# Patient Record
Sex: Female | Born: 1965 | Race: Black or African American | Hispanic: No | Marital: Single | Smoking: Never smoker
Health system: Southern US, Community
[De-identification: ages and names within clinical notes are randomized; demographics above are authoritative.]

## PROBLEM LIST (undated history)

## (undated) DIAGNOSIS — J810 Acute pulmonary edema: Secondary | ICD-10-CM

## (undated) DIAGNOSIS — I351 Nonrheumatic aortic (valve) insufficiency: Secondary | ICD-10-CM

## (undated) DIAGNOSIS — I350 Nonrheumatic aortic (valve) stenosis: Secondary | ICD-10-CM

## (undated) DIAGNOSIS — E7800 Pure hypercholesterolemia, unspecified: Secondary | ICD-10-CM

## (undated) DIAGNOSIS — I493 Ventricular premature depolarization: Secondary | ICD-10-CM

## (undated) DIAGNOSIS — I428 Other cardiomyopathies: Secondary | ICD-10-CM

## (undated) HISTORY — DX: Other cardiomyopathies: I42.8

## (undated) HISTORY — DX: Nonrheumatic aortic (valve) insufficiency: I35.1

## (undated) HISTORY — DX: Ventricular premature depolarization: I49.3

## (undated) HISTORY — DX: Nonrheumatic aortic (valve) stenosis: I35.0

## (undated) HISTORY — DX: Acute pulmonary edema: J81.0

## (undated) HISTORY — DX: Pure hypercholesterolemia, unspecified: E78.00

---

## 2000-05-14 ENCOUNTER — Encounter: Payer: Self-pay | Admitting: Emergency Medicine

## 2000-05-14 ENCOUNTER — Encounter: Payer: Self-pay | Admitting: Cardiology

## 2000-05-14 ENCOUNTER — Inpatient Hospital Stay (HOSPITAL_COMMUNITY): Admission: EM | Admit: 2000-05-14 | Discharge: 2000-05-16 | Payer: Self-pay | Admitting: Emergency Medicine

## 2002-03-01 ENCOUNTER — Encounter: Payer: Self-pay | Admitting: Emergency Medicine

## 2002-03-01 ENCOUNTER — Emergency Department (HOSPITAL_COMMUNITY): Admission: EM | Admit: 2002-03-01 | Discharge: 2002-03-01 | Payer: Self-pay | Admitting: Emergency Medicine

## 2004-05-06 HISTORY — PX: US ECHOCARDIOGRAPHY: HXRAD669

## 2008-08-28 HISTORY — PX: US ECHOCARDIOGRAPHY: HXRAD669

## 2009-10-06 ENCOUNTER — Ambulatory Visit: Payer: Self-pay | Admitting: Cardiology

## 2010-01-31 DIAGNOSIS — J810 Acute pulmonary edema: Secondary | ICD-10-CM

## 2010-01-31 HISTORY — DX: Acute pulmonary edema: J81.0

## 2010-06-18 NOTE — Discharge Summary (Signed)
[facility]  Patient:    Wendy Stanley, Wendy Stanley                   MRN: [id number] Adm. Date:  [id number] Disc. Date: 05/16/00 Attending:  Lillia Mountain CC:         Clovis Pu. Patty Sermons, M.D.   Discharge Summary  REASON FOR ADMISSION:         A 45 year old healthy black female who the morning of April 14 suddenly became severely short of breath associated with some mild dizziness, but no chest pain.  She was brought to the ER and found to be in acute pulmonary edema of new onset.  PHYSICAL EXAMINATION  VITAL SIGNS:                  Initial blood pressure 213/93, dropped down to 154/88, heart rate 145, respirations 32, temperature 97.3.  LUNGS:                        Crackles at the bases.  HEART:                        Tachycardic, regular, initially no murmur or gallop but when heart rate dropped she had a 2/6 diastolic murmur along the left sternal border with a ______ gallop.  EXTREMITIES:                  No edema.  LABORATORIES:                 Sodium 139, potassium 4.5, chloride 109, bicarbonate 24, glucose 117, BUN 10, creatinine 1.0, calcium 9.2.  CK 292, CPK-MB 2.8, troponin 0.05.                                Chest x-ray showed diffuse alveolar bilateral infiltrates with a normal sized cardiac silhouette.  EKG showed sinus tachycardia with nonspecific lateral ST-T wave changes.  HOSPITAL COURSE:              #1 - PULMONARY EDEMA:  The patient was admitted with acute pulmonary edema.  She was given one dose of Lasix 40 mg IV in the ER and diuresed very well with resolution of her symptoms by later on the day of admission.  She was started on Lovenox and aspirin.  Serial cardiac enzymes showed negative CPK-MBs and borderline troponins.  A repeat chest x-ray later the day of her admission showed resolution of her infiltrates.  She was seen by Dr. Patty Sermons of the cardiology service.  His initial impression was acute pulmonary edema  of undetermined etiology, rule out silent MI versus valvular heart disease.  A CT scan of the chest was done to rule out an aortic dissection and this was negative for dissection.  Blood cultures were drawn to rule out SVD and were negative at the time of this dictation.  The patient remained afebrile and her ESR was only 28.  An echocardiogram was done on April 15 and preliminary review by Dr. Patty Sermons indicated the aortic valve may be congenitally bicuspid.  There was moderate aortic insufficiency with a small LV chamber size which was not hypertrophied and showed normal systolic function.  The patient was started on hydrochlorothiazide, lisinopril, and potassium by Dr. Patty Sermons.  He recommended medical therapy and blood pressure control at this point with clinical followup.  She may  at some point require aortic valve replacement.  The patient was also started on SVE prophylaxis for dental and surgical procedures.  Dr. Patty Sermons recommended avoiding strenuous isometric exercise.                                The patients blood pressure was elevated in the hospital as high as 196/85.  She was started on lisinopril and hydrochlorothiazide and at discharge her blood pressure was 144/71.  Her tachycardia was resolving.                                The patients TSH was initially elevated at approximately 10.  A repeat TSH was normal at 2.8.  The patient also was started on a 24 hour urine for metanephrines.  DISCHARGE DIAGNOSES:          1. Acute pulmonary edema.                               2. Aortic insufficiency.                               3. Hypertension.  PROCEDURE:                    1. A 2-D echocardiogram.                               2. CT scan of the chest.  DISCHARGE MEDICATIONS:        1. Lisinopril 10 mg q.d.                               2. Hydrochlorothiazide 25 mg q.d.                               3. Potassium chloride 20 mEq q.d.                                4. Multivitamin.                               5. Amoxicillin 2000 mg one hour prior to dental                                 procedures.  ACTIVITY:                     No heavy exercise until seen by Dr. Patty Sermons in two weeks.  DIET:                         No added salt.  DISCHARGE INSTRUCTIONS:       Return to work May 22, 2000.  FOLLOW-UP:                    Dr. Patty Sermons two weeks.  Dr. Valentina Lucks in four weeks. DD:  05/16/00 TD:  05/16/00 Job: [id number]

## 2010-06-18 NOTE — Consult Note (Signed)
[facility]  Patient:    Wendy Stanley, Wendy Stanley                   MRN: [id number] Proc. Date: 05/14/00 Adm. Date:  [id number] Attending:  Tobey Bride Dictator:   Clovis Pu Patty Sermons, M.D. CC:         Thora Lance, M.D.   Consultation Report  CHIEF COMPLAINT:  Dyspnea.  HISTORY:  This is a 45 year old single black female, previously very healthy, who presented to the emergency room after the sudden onset of flash acute pulmonary edema at approximately 2 a.m. today.  This was not associated with any chest pain.  She has had a prior history of borderline systolic hypertension, not requiring any therapy.  About 20 years ago she was told that she had a heart murmur, but it was felt to be benign and has not required any attention.  She had essentially forgotten about it.  She has never been told of any need for FPE prophylaxis etc.  The patient is generally healthy, avoids high cholesterol foods; exercises regularly at the health club.  She keeps her weight down; does not smoke, etc.  She has a drink about once a month.  She has not had any history of exertional chest pain or shortness of breath when exercising.  Yesterday evening she was with friends at a picnic; ate a hot dog and some baked beans.  She came home and was watching television, when at about 1 a.m. she developed marked orthopnea.  She became nauseated and vomited the hot dog and some grape juice that she had drank.  The acute dyspnea persisted and she came to the emergency room, where she was found to be in frank pulmonary edema by x-ray and clinically.  Blood pressure on admission was 213/93.  Pulse was 145.  Respirations were 32.  She has diuresed about 2400 cc, and presently is breathing comfortably; although she is still tachycardic.  She has been on no medications on a chronic basis, except for Centrum multivitamins and birth control pill.  She has not been on any over-the-counter  remedies, such as sinus pills or anything that contains sympathomimetic.  Her drug screen on admission here was normal.  PAST MEDICAL HISTORY:  Reveals that she had a breast biopsy many years ago. She was told that her blood pressure was about 168 systolic when her primary care physician saw her in February for sinusitis.  REVIEW OF SYSTEMS:  Reveals that she has not been experiencing any night sweats, chills, fever, weight loss or other constitutional symptoms.  She denies any change in bowel habits, hematochezia or melena.  She is not having any genitourinary symptoms.  SOCIAL HISTORY:  She is single.  She is a Programmer, multimedia. She does not smoke.  FAMILY HISTORY:  Reveals that her father is living at age 40, and has high blood pressure.  Her mother is living at age 70 and had thyroid trouble.  She has a brother who died of leukemia at age 11.  She has a brother who has died of sudden infant death syndrome as an infant.  She has four living sisters, all in good health.  Family history is negative for Marfan syndrome or sudden death.  The remainder of the review of systems is unremarkable.  PHYSICAL EXAMINATION:  VITAL SIGNS:  Blood pressure 118/70, pulse 112 and regular, respirations normal.  GENERAL APPEARANCE:  That of a thin black female, looking  younger than her stated age.  HEAD/NECK:  Unremarkable.  Jugular venous pressure normal.  Carotids normal. Thyroid not enlarged nor tender.  There is no lymphadenopathy.  CHEST:  Revealed a few basilar rales.  HEART:  Reveals a grade 2-3/6 diastolic murmur of aortic insufficiency at the patients left sternal edge.  There is also a summation gallop heard at the apex.  ABDOMEN:  Soft and nontender.  Liver and spleen are not enlarged.  She has good femoral pulses.  There are no abdominal or femoral bruits.  EXTREMITIES:  Show good peripheral pulses; no edema or phlebitis.  CHEST XRAY:  Shows severe pulmonary  edema on the portable film.  We are getting a PA and lateral now that she has diuresed.  ELECTROCARDIOGRAM:  (on admission)  Showed sinus tachycardia with biatrial enlargement and non ______ ST changes.  LABORATORY STUDIES:  Troponin 0.05 initially; on the second specimen is 0.10 -- probably reflecting CHF.  DIAGNOSTIC IMPRESSION: 1. Flash acute pulmonary edema, unknown etiology; rule out silent    myocardial infarction secondary to coronary disease -- but, this    would appear to be unlikely in view of her negative risk factors.    Rule out acute pulmonary edema secondary to aortic insufficiency. 2. Aortic insufficiency, of uncertain etiology; with a prior history    of a benign heart murmur.  Doubt SVE but need to rule out.  Also    need to rule out aortic root dissection or idiopathic dilatation    of the aortic root.  There is no history in the family of Marfans.  DISPOSITION:  We are going to diurese.  Treat her with oxygen and bed rest. Will get a CT scan of the chest today to look at the aortic root, and a get a 2d echocardiogram on Monday.  She may require cardiac catheterization.  Serial enzymes will be obtained.  We will watch her potassium closely, since she is diuresing briskly. DD:  05/14/00 TD:  05/14/00 Job: [id number]

## 2010-06-18 NOTE — H&P (Signed)
[facility]  Patient:    Wendy Stanley, Wendy Stanley                   MRN: [id number] Attending:  Thora Lance, M.D.                         History and Physical  CHIEF COMPLAINT:              Shortness of breath.  HISTORY OF PRESENT ILLNESS:   This 45 year old healthy black female gives a history of going to bed at 2:00 a.m.  She had the sudden onset of shortness of breath, both at rest and with exertion, with mild dizziness but chest pain, pressure or tightness and no palpitations.  She was brought to the ER and found to have acute pulmonary edema and was given Lasix IV with the diuresis of 1700 cc, an albuterol nebulizer and some nitroglycerin paste was applied. She now feels much better.  She was noted to have sinus tachycardia in the 140s.  Prior to tonight, the patient has felt great.  She is in excellent health.  She denies any recent chest pain, shortness of breath, PND, orthopnea, edema, fatigue or weight loss.  Her last URI was in January 2002. She has no cardiac risk factors.  She was told that her blood pressure was high at Urgent Care in January 2002.  She was at a cook out tonight and had a hot dog and some beans.  She denies any illicit drug use.  She is very active and exercises vigorously three times a well without any problem.  PAST MEDICAL HISTORY:         Negative.  PAST SURGICAL HISTORY:        Breast biopsy, benign.  Wisdom teeth.  ALLERGIES:                    No known drug allergies.  CURRENT MEDICATIONS:          1. Ortho-Novum 7/7/7.                               2. Multivitamin.                               3. Garlic tablets.  FAMILY HISTORY:               Thyroid disease in her mother and two sisters. Hypertension.  SOCIAL HISTORY:               She is unmarried.  No children.  She does not smoke.  She drinks about one alcoholic beverage a month.  No illegal drug use. 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  REVIEW OF SYSTEMS:            As above.  PHYSICAL EXAMINATION:  GENERAL:                      Healthy-appearing black female.  VITAL SIGNS:                  Blood pressure initially 213/93, now 154/88. Heart rate 145, respirations 32, temperature 97.3.  HEENT:  Pupils are equal, round and respond to light. Extraocular movements intact.  Funduscopic normal.  Ears blocked by cerumen. Oropharynx clear.  NECK:                         Supple.  There is no lymphadenopathy.  No thyromegaly.  No bruits.  LUNGS:                        Crackles at the bases.  HEART:                        Tachycardic.  Regular.  No murmur or gallop.  No JVD.  ABDOMEN:                      Soft and nontender.  Normal bowel sounds.  No mass or hepatosplenomegaly.  PELVIC AND RECTAL:            Deferred.  EXTREMITIES:                  No edema.  NEUROLOGIC:                   Nonfocal.  LABORATORY DATA:              Sodium 137, potassium 3.5, chloride 106, bicarbonate 26, BUN 10, creatinine 1.0, glucose 117, calcium 9.1, total protein 8.5, albumin 3.9, SGOT 32, SGPT 21, alkaline phosphatase 55, total bilirubin 0.7.  CBC showed wbc 6.5, hemoglobin 13.5, platelet count 263,000. CPK 239, CPK-MD 2.3, troponin I 0.05.  Urinalysis negative.  Urine pregnancy test negative.  Urine drug screen negative.  Chest x-ray shows a normal cardiac silhouette and acute pulmonary edema.  EKG shows sinus tachycardia with a rate of 142 and nonspecific lateral ST changes.  ASSESSMENT:                   Flash pulmonary edema in a healthy young female. The differential is a cardiomyopathy, high output state (question hypothyroidism) and cardiac ischemia (which I doubt).  She has no cardiac risk factors.  PLAN:                         Admit.  Lasix 40 mg IV given in the ER. Lovenox.  Aspirin.  Two-dimensional echo.  Serial cardiac enzymes.  Thyroid function tests.  Cardiology consultation.   Repeat chest x-ray on the morning of April 15. DD:  05/14/00 TD:  05/14/00 Job: [id number]

## 2010-09-17 ENCOUNTER — Other Ambulatory Visit: Payer: Self-pay | Admitting: *Deleted

## 2010-09-17 ENCOUNTER — Telehealth: Payer: Self-pay | Admitting: Cardiology

## 2010-09-17 DIAGNOSIS — I119 Hypertensive heart disease without heart failure: Secondary | ICD-10-CM

## 2010-09-17 DIAGNOSIS — I350 Nonrheumatic aortic (valve) stenosis: Secondary | ICD-10-CM

## 2010-09-17 NOTE — Telephone Encounter (Signed)
Patient needs to have an echocardiogram scheduled prior to her 1 yr f/u appointment.  According to Dr.Brackbill's appt.schedule, he does not have any openings until mid-Nov.

## 2010-09-17 NOTE — Telephone Encounter (Signed)
Scheduled echo and appointment

## 2010-09-28 ENCOUNTER — Other Ambulatory Visit (INDEPENDENT_AMBULATORY_CARE_PROVIDER_SITE_OTHER): Payer: PRIVATE HEALTH INSURANCE | Admitting: *Deleted

## 2010-09-28 ENCOUNTER — Encounter: Payer: Self-pay | Admitting: Cardiology

## 2010-09-28 ENCOUNTER — Ambulatory Visit (HOSPITAL_COMMUNITY): Payer: PRIVATE HEALTH INSURANCE | Attending: Cardiology

## 2010-09-28 DIAGNOSIS — I350 Nonrheumatic aortic (valve) stenosis: Secondary | ICD-10-CM

## 2010-09-28 DIAGNOSIS — I10 Essential (primary) hypertension: Secondary | ICD-10-CM | POA: Insufficient documentation

## 2010-09-28 DIAGNOSIS — J811 Chronic pulmonary edema: Secondary | ICD-10-CM | POA: Insufficient documentation

## 2010-09-28 DIAGNOSIS — I119 Hypertensive heart disease without heart failure: Secondary | ICD-10-CM

## 2010-09-28 DIAGNOSIS — I359 Nonrheumatic aortic valve disorder, unspecified: Secondary | ICD-10-CM

## 2010-09-28 LAB — BASIC METABOLIC PANEL
CO2: 25 mEq/L (ref 19–32)
Calcium: 9 mg/dL (ref 8.4–10.5)
Chloride: 109 mEq/L (ref 96–112)
Glucose, Bld: 94 mg/dL (ref 70–99)
Potassium: 3.9 mEq/L (ref 3.5–5.1)
Sodium: 141 mEq/L (ref 135–145)

## 2010-09-28 LAB — LIPID PANEL
Cholesterol: 193 mg/dL (ref 0–200)
HDL: 76 mg/dL (ref >39.00)
LDL Cholesterol: 106 mg/dL — ABNORMAL HIGH (ref 0–99)
Total CHOL/HDL Ratio: 3
Triglycerides: 54 mg/dL (ref 0.0–149.0)
VLDL: 10.8 mg/dL (ref 0.0–40.0)

## 2010-09-28 LAB — HEPATIC FUNCTION PANEL
ALT: 24 U/L (ref 0–35)
AST: 33 U/L (ref 0–37)
Albumin: 3.9 g/dL (ref 3.5–5.2)
Alkaline Phosphatase: 79 U/L (ref 39–117)
Bilirubin, Direct: 0.1 mg/dL (ref 0.0–0.3)
Total Bilirubin: 0.6 mg/dL (ref 0.3–1.2)
Total Protein: 7.5 g/dL (ref 6.0–8.3)

## 2010-10-13 ENCOUNTER — Encounter: Payer: Self-pay | Admitting: Cardiology

## 2010-10-13 ENCOUNTER — Ambulatory Visit (INDEPENDENT_AMBULATORY_CARE_PROVIDER_SITE_OTHER): Payer: PRIVATE HEALTH INSURANCE | Admitting: Cardiology

## 2010-10-13 VITALS — BP 144/90 | HR 87 | Wt 145.0 lb

## 2010-10-13 DIAGNOSIS — I119 Hypertensive heart disease without heart failure: Secondary | ICD-10-CM | POA: Insufficient documentation

## 2010-10-13 DIAGNOSIS — E7800 Pure hypercholesterolemia, unspecified: Secondary | ICD-10-CM

## 2010-10-13 DIAGNOSIS — I351 Nonrheumatic aortic (valve) insufficiency: Secondary | ICD-10-CM | POA: Insufficient documentation

## 2010-10-13 DIAGNOSIS — I10 Essential (primary) hypertension: Secondary | ICD-10-CM | POA: Insufficient documentation

## 2010-10-13 DIAGNOSIS — E876 Hypokalemia: Secondary | ICD-10-CM

## 2010-10-13 DIAGNOSIS — I359 Nonrheumatic aortic valve disorder, unspecified: Secondary | ICD-10-CM

## 2010-10-13 HISTORY — DX: Nonrheumatic aortic (valve) insufficiency: I35.1

## 2010-10-13 MED ORDER — POTASSIUM CHLORIDE CRYS ER 20 MEQ PO TBCR: 20.0000 meq | EXTENDED_RELEASE_TABLET | Freq: Every day | ORAL | Status: DC

## 2010-10-13 MED ORDER — LISINOPRIL 20 MG PO TABS: 20.0000 mg | ORAL_TABLET | Freq: Every day | ORAL | Status: DC

## 2010-10-13 MED ORDER — FUROSEMIDE 40 MG PO TABS: 40.0000 mg | ORAL_TABLET | Freq: Every day | ORAL | Status: DC

## 2010-10-13 NOTE — Assessment & Plan Note (Signed)
No present cardiac symptoms related to her aortic valve regurgitation.  We reviewed her echocardiogram results from 09/28/10 which showed ejection fraction 55-60% and mild aortic stenosis with moderate aortic regurgitation.  The echocardiogram was essentially unchanged from previous echo.

## 2010-10-13 NOTE — Progress Notes (Signed)
Wendy Stanley Date of Birth:  [birth year] [facility] [address]   [address] [place], [place]  [address] [phone]           Fax   [phone]  History of Present Illness: This pleasant 45 year old [place] American woman is seen for a followup one-year office visit.  She has a past history of known aortic valve insufficiency and she was admitted to [facility] hospital in 2002 with flash pulmonary edema.  Since then she has been on daily ACE inhibitor and furosemide and has been clinically doing well.  She has not been experiencing any chest pain or shortness of breath.  She's not been having any orthopnea or paroxysmal nocturnal dyspnea.  She has not been aware of any palpitations.  She exercises regularly at the gym.  Current Outpatient Prescriptions  Medication Sig Dispense Refill  . aspirin EC 81 MG tablet Take 81 mg by mouth daily.        . furosemide (LASIX) 40 MG tablet Take 1 tablet (40 mg total) by mouth daily.  30 tablet  11  . lisinopril (PRINIVIL,ZESTRIL) 20 MG tablet Take 1 tablet (20 mg total) by mouth daily.  30 tablet  11  . Multiple Vitamin (MULTIVITAMIN) tablet Take 1 tablet by mouth daily.        . potassium chloride SA (K-DUR,KLOR-CON) 20 MEQ tablet Take 1 tablet (20 mEq total) by mouth daily.  30 tablet  11    No Known Allergies  There is no problem list on file for this patient.   History  Smoking status  . Never Smoker   Smokeless tobacco  . Not on file    History  Alcohol Use No    Family History  Problem Relation Age of Onset  . Hypertension Father   . Kidney failure Father   . Mitral valve prolapse Sister   . Mitral valve prolapse Sister     Review of Systems: Constitutional: no fever chills diaphoresis or fatigue or change in weight.  Head and neck: no hearing loss, no epistaxis, no photophobia or visual disturbance. Respiratory: No cough, shortness of breath or wheezing. Cardiovascular: No chest pain  peripheral edema, palpitations. Gastrointestinal: No abdominal distention, no abdominal pain, no change in bowel habits hematochezia or melena. Genitourinary: No dysuria, no frequency, no urgency, no nocturia. Musculoskeletal:No arthralgias, no back pain, no gait disturbance or myalgias. Neurological: No dizziness, no headaches, no numbness, no seizures, no syncope, no weakness, no tremors. Hematologic: No lymphadenopathy, no easy bruising. Psychiatric: No confusion, no hallucinations, no sleep disturbance.    Physical Exam: Filed Vitals:   10/13/10 1028  BP: 144/90  Pulse: 87  The general appearance reveals a well-developed well-nourished woman in no distress.Pupils equal and reactive.   Extraocular Movements are full.  There is no scleral icterus.  The mouth and pharynx are normal.  The neck is supple.  The carotids reveal no bruits.  The jugular venous pressure is normal.  The thyroid is not enlarged.  There is no lymphadenopathy.The chest is clear to percussion and auscultation. There are no rales or rhonchi. Expansion of the chest is symmetrical.  The heart reveals a soft systolic ejection murmur at the base and a grade 2/6 decrescendo murmur of aortic regurgitation at the left sternal edge.  No gallop or rub.The abdomen is soft and nontender. Bowel sounds are normal. The liver and spleen are not enlarged. There Are no abdominal masses. There are no bruits.  The pedal  pulses are good.  There is no phlebitis or edema.  There is no cyanosis or clubbing.  Strength is normal and symmetrical in all extremities.  There is no lateralizing weakness.  There are no sensory deficits.  The skin is warm and dry.  There is no rash.    EKG shows normal sinus rhythm and is within normal limits.       Assessment / Plan: Continue same medication and be rechecked in one year for followup office visit EKG, and fasting lab work.

## 2010-10-13 NOTE — Assessment & Plan Note (Signed)
Normally the patient's blood pressure has been running normal.  However she was offered diet and was eating more salty foods over the summer and has gained about 5 pounds and had not been exercising as regularly.  Today her blood pressure was elevated.  She intends to get back on her careful diet and her careful exercise program and we will not increase her blood pressure medication at this time.  She will monitor her blood pressure at home and it stays above 130/80 with she will let us know

## 2010-10-14 ENCOUNTER — Encounter: Payer: Self-pay | Admitting: Cardiology

## 2011-11-22 ENCOUNTER — Telehealth: Payer: Self-pay | Admitting: Cardiology

## 2011-11-22 DIAGNOSIS — I119 Hypertensive heart disease without heart failure: Secondary | ICD-10-CM

## 2011-11-22 NOTE — Telephone Encounter (Signed)
New problem:    Patient called in needing a refill of her lisinopril (PRINIVIL,ZESTRIL) 20 MG tablet.

## 2011-11-23 MED ORDER — LISINOPRIL 20 MG PO TABS: 20.0000 mg | ORAL_TABLET | Freq: Every day | ORAL | Status: DC

## 2011-11-23 NOTE — Telephone Encounter (Signed)
RX sent into pharmacy. lmom for pt.

## 2011-12-26 ENCOUNTER — Other Ambulatory Visit: Payer: Self-pay | Admitting: *Deleted

## 2011-12-26 DIAGNOSIS — I119 Hypertensive heart disease without heart failure: Secondary | ICD-10-CM

## 2011-12-26 DIAGNOSIS — I351 Nonrheumatic aortic (valve) insufficiency: Secondary | ICD-10-CM

## 2011-12-27 ENCOUNTER — Ambulatory Visit (INDEPENDENT_AMBULATORY_CARE_PROVIDER_SITE_OTHER): Payer: PRIVATE HEALTH INSURANCE | Admitting: Cardiology

## 2011-12-27 ENCOUNTER — Other Ambulatory Visit (INDEPENDENT_AMBULATORY_CARE_PROVIDER_SITE_OTHER): Payer: PRIVATE HEALTH INSURANCE

## 2011-12-27 ENCOUNTER — Encounter: Payer: Self-pay | Admitting: Cardiology

## 2011-12-27 VITALS — BP 133/70 | HR 87 | Resp 18 | Ht 67.0 in | Wt 134.8 lb

## 2011-12-27 DIAGNOSIS — I351 Nonrheumatic aortic (valve) insufficiency: Secondary | ICD-10-CM

## 2011-12-27 DIAGNOSIS — E7800 Pure hypercholesterolemia, unspecified: Secondary | ICD-10-CM

## 2011-12-27 DIAGNOSIS — I119 Hypertensive heart disease without heart failure: Secondary | ICD-10-CM

## 2011-12-27 DIAGNOSIS — I359 Nonrheumatic aortic valve disorder, unspecified: Secondary | ICD-10-CM

## 2011-12-27 HISTORY — DX: Pure hypercholesterolemia, unspecified: E78.00

## 2011-12-27 LAB — LIPID PANEL
HDL: 73.5 mg/dL (ref >39.00)
Triglycerides: 88 mg/dL (ref 0.0–149.0)

## 2011-12-27 LAB — BASIC METABOLIC PANEL
CO2: 27 mEq/L (ref 19–32)
Calcium: 9.4 mg/dL (ref 8.4–10.5)
Sodium: 137 mEq/L (ref 135–145)

## 2011-12-27 LAB — HEPATIC FUNCTION PANEL
ALT: 21 U/L (ref 0–35)
Total Bilirubin: 0.6 mg/dL (ref 0.3–1.2)
Total Protein: 8 g/dL (ref 6.0–8.3)

## 2011-12-27 LAB — LDL CHOLESTEROL, DIRECT: Direct LDL: 123.7 mg/dL

## 2011-12-27 NOTE — Patient Instructions (Signed)
Will obtain labs today and call you with the results (lp/bmet/hfp)  Your physician recommends that you continue on your current medications as directed. Please refer to the Current Medication list given to you today.   Your physician wants you to follow-up in: 1 year You will receive a reminder letter in the mail two months in advance. If you don't receive a letter, please call our office to schedule the follow-up appointment.   WILL HAVE YOU GO FOR A CHEST XRAY SOON---[place] [facility] FROM [facility] BETWEEN 8:30-4:30

## 2011-12-27 NOTE — Assessment & Plan Note (Signed)
The patient's blood pressure has been remaining normal.  She's not having any headaches or dizzy spells.  She is very healthfully and she avoids salt

## 2011-12-27 NOTE — Addendum Note (Signed)
Addended by: Regis Bill B on: 12/27/2011 02:05 PM   Modules accepted: Orders

## 2011-12-27 NOTE — Assessment & Plan Note (Signed)
The patient has not been experiencing any orthopnea or paroxysmal nocturnal dyspnea.  Exercise tolerance is good and she is able to work out at the gym with no limitations.  She's had no dizziness or syncope.

## 2011-12-27 NOTE — Assessment & Plan Note (Signed)
The patient is not on any statin therapy.  She is managing her lipids with diet and exercise.

## 2011-12-27 NOTE — Progress Notes (Signed)
Wendy Stanley Date of Birth:  1965-03-29 [facility] [address] [place], [place]  [address] [phone]  Fax   [phone]  HPI: This pleasant 46 year old African American woman is seen for a followup one-year office visit. She has a past history of known aortic valve insufficiency and she was admitted to [facility] long [facility] in 2002 with flash pulmonary edema. Since then she has been on daily ACE inhibitor and furosemide and has been clinically doing well. She has not been experiencing any chest pain or shortness of breath. She's not been having any orthopnea or paroxysmal nocturnal dyspnea. She has not been aware of any palpitations. She exercises regularly at the gym.  Since last visit she has intentionally lost 11 pounds.  Her last echocardiogram was on 09/28/10 and showed normal ejection fraction of 55-60% with grade 2 diastolic dysfunction and showed mild aortic stenosis with moderate aortic insufficiency.   Current Outpatient Prescriptions  Medication Sig Dispense Refill  . aspirin EC 81 MG tablet Take 81 mg by mouth daily.        . furosemide (LASIX) 40 MG tablet Take 1 tablet (40 mg total) by mouth daily.  30 tablet  11  . lisinopril (PRINIVIL,ZESTRIL) 20 MG tablet Take 1 tablet (20 mg total) by mouth daily.  30 tablet  3  . Multiple Vitamin (MULTIVITAMIN) tablet Take 1 tablet by mouth daily.        . potassium chloride SA (K-DUR,KLOR-CON) 20 MEQ tablet Take 1 tablet (20 mEq total) by mouth daily.  30 tablet  11    No Known Allergies  Patient Active Problem List  Diagnosis  . Aortic valve regurgitation  . Benign hypertensive heart disease without heart failure    History  Smoking status  . Never Smoker   Smokeless tobacco  . Not on file    History  Alcohol Use No    Family History  Problem Relation Age of Onset  . Hypertension Father   . Kidney failure Father   . Mitral valve prolapse Sister   . Mitral valve prolapse Sister      Review of Systems: The patient denies any heat or cold intolerance.  No weight gain or weight loss.  The patient denies headaches or blurry vision.  There is no cough or sputum production.  The patient denies dizziness.  There is no hematuria or hematochezia.  The patient denies any muscle aches or arthritis.  The patient denies any rash.  The patient denies frequent falling or instability.  There is no history of depression or anxiety.  All other systems were reviewed and are negative.   Physical Exam: Filed Vitals:   12/27/11 0856  BP: 133/70  Pulse: 87  Resp: 18   the general appearance reveals a well-developed well-nourished slender woman in no distress.The head and neck exam reveals pupils equal and reactive.  Extraocular movements are full.  There is no scleral icterus.  The mouth and pharynx are normal.  The neck is supple.  The carotids reveal no bruits.  The jugular venous pressure is normal.  The  thyroid is not enlarged.  There is no lymphadenopathy.  The chest is clear to percussion and auscultation.  There are no rales or rhonchi.  Expansion of the chest is symmetrical.  The precordium is quiet.  The first heart sound is normal.  The second heart sound is physiologically split.  There is a grade 2/6 decrescendo murmur of aortic insufficiency at the base.  No gallop  or rub There is no abnormal lift or heave.  The abdomen is soft and nontender.  The bowel sounds are normal.  The liver and spleen are not enlarged.  There are no abdominal masses.  There are no abdominal bruits.  Extremities reveal good pedal pulses.  There is no phlebitis or edema.  There is no cyanosis or clubbing.  Strength is normal and symmetrical in all extremities.  There is no lateralizing weakness.  There are no sensory deficits.  The skin is warm and dry.  There is no rash.  EKG shows normal sinus rhythm and no ischemic changes.  She has an occasional PVC   Assessment / Plan: The patient is to continue same  medication.  We are going to get a chest x-ray to look at heart size.  Her last x-ray was in 2004.  Her last echocardiogram was in 2012. No change in medications today.  Recheck in one year for followup office visit lipid panel hepatic function panel and basal metabolic panel.  Lab work today is pending.

## 2011-12-30 NOTE — Progress Notes (Signed)
Quick Note:  Please report to patient. The recent labs are stable. Continue same medication and careful diet. Total cholesterol 215 higher. Continue strict low cholesterol diet. ______

## 2012-01-02 ENCOUNTER — Telehealth: Payer: Self-pay | Admitting: Cardiology

## 2012-01-02 DIAGNOSIS — E876 Hypokalemia: Secondary | ICD-10-CM

## 2012-01-02 DIAGNOSIS — I119 Hypertensive heart disease without heart failure: Secondary | ICD-10-CM

## 2012-01-02 MED ORDER — FUROSEMIDE 40 MG PO TABS: 40.0000 mg | ORAL_TABLET | Freq: Every day | ORAL | Status: DC

## 2012-01-02 MED ORDER — LISINOPRIL 20 MG PO TABS: 20.0000 mg | ORAL_TABLET | Freq: Every day | ORAL | Status: DC

## 2012-01-02 MED ORDER — POTASSIUM CHLORIDE CRYS ER 20 MEQ PO TBCR: 20.0000 meq | EXTENDED_RELEASE_TABLET | Freq: Every day | ORAL | Status: DC

## 2012-01-02 NOTE — Telephone Encounter (Signed)
Pt has lost her password for my chart and she was wondering could it be faxed to her. Pt did not get Rx at last ov

## 2012-01-02 NOTE — Telephone Encounter (Signed)
Rx's called to pharmacy.   Left message to call back

## 2012-01-02 NOTE — Telephone Encounter (Signed)
Follow-up:   Wendy Stanley: Patient called in wanting to know what the status of her return mychart phone call..  Please call back.  Refill: Patient also needed refills of her potassium chloride SA (K-DUR,KLOR-CON) 20 MEQ tablet, lisinopril (PRINIVIL,ZESTRIL) 20 MG tablet and furosemide (LASIX) 40 MG tablet.

## 2012-01-03 ENCOUNTER — Telehealth: Payer: Self-pay | Admitting: *Deleted

## 2012-01-03 NOTE — Telephone Encounter (Signed)
Advised patient of lab results  

## 2012-01-03 NOTE — Telephone Encounter (Signed)
Follow-up:    Patient returned your call.  Please call back. 

## 2012-01-03 NOTE — Telephone Encounter (Signed)
Message copied by Burnell Blanks on Tue Jan 03, 2012  5:22 PM ------      Message from: Cassell Clement      Created: Fri Dec 30, 2011  2:47 PM       Please report to patient.  The recent labs are stable. Continue same medication and careful diet. Total cholesterol 215 higher.  Continue strict low cholesterol diet.

## 2013-01-28 ENCOUNTER — Other Ambulatory Visit: Payer: Self-pay | Admitting: Cardiology

## 2013-03-05 ENCOUNTER — Encounter: Payer: Self-pay | Admitting: Cardiology

## 2013-03-05 ENCOUNTER — Encounter (INDEPENDENT_AMBULATORY_CARE_PROVIDER_SITE_OTHER): Payer: Self-pay

## 2013-03-05 ENCOUNTER — Ambulatory Visit (INDEPENDENT_AMBULATORY_CARE_PROVIDER_SITE_OTHER): Payer: PRIVATE HEALTH INSURANCE | Admitting: Cardiology

## 2013-03-05 VITALS — BP 190/96 | HR 104 | Ht 67.0 in | Wt 138.0 lb

## 2013-03-05 DIAGNOSIS — I351 Nonrheumatic aortic (valve) insufficiency: Secondary | ICD-10-CM

## 2013-03-05 DIAGNOSIS — I119 Hypertensive heart disease without heart failure: Secondary | ICD-10-CM

## 2013-03-05 DIAGNOSIS — I359 Nonrheumatic aortic valve disorder, unspecified: Secondary | ICD-10-CM

## 2013-03-05 DIAGNOSIS — E7800 Pure hypercholesterolemia, unspecified: Secondary | ICD-10-CM

## 2013-03-05 LAB — BASIC METABOLIC PANEL
BUN: 13 mg/dL (ref 6–23)
CO2: 26 mEq/L (ref 19–32)
Calcium: 9.4 mg/dL (ref 8.4–10.5)
Chloride: 106 mEq/L (ref 96–112)
Creatinine, Ser: 0.9 mg/dL (ref 0.4–1.2)
GFR: 83.43 mL/min (ref >60.00)
GLUCOSE: 87 mg/dL (ref 70–99)
POTASSIUM: 3.8 meq/L (ref 3.5–5.1)
SODIUM: 139 meq/L (ref 135–145)

## 2013-03-05 LAB — HEPATIC FUNCTION PANEL
ALBUMIN: 3.9 g/dL (ref 3.5–5.2)
ALT: 21 U/L (ref 0–35)
AST: 28 U/L (ref 0–37)
Alkaline Phosphatase: 76 U/L (ref 39–117)
BILIRUBIN TOTAL: 0.8 mg/dL (ref 0.3–1.2)
Bilirubin, Direct: 0 mg/dL (ref 0.0–0.3)
Total Protein: 7.9 g/dL (ref 6.0–8.3)

## 2013-03-05 LAB — CBC WITH DIFFERENTIAL/PLATELET
BASOS PCT: 0.8 % (ref 0.0–3.0)
Basophils Absolute: 0 10*3/uL (ref 0.0–0.1)
Eosinophils Absolute: 0.1 10*3/uL (ref 0.0–0.7)
Eosinophils Relative: 3.1 % (ref 0.0–5.0)
HEMATOCRIT: 40.2 % (ref 36.0–46.0)
HEMOGLOBIN: 12.9 g/dL (ref 12.0–15.0)
LYMPHS ABS: 2 10*3/uL (ref 0.7–4.0)
LYMPHS PCT: 50.7 % — AB (ref 12.0–46.0)
MCHC: 32.2 g/dL (ref 30.0–36.0)
MCV: 85.2 fl (ref 78.0–100.0)
MONOS PCT: 8.6 % (ref 3.0–12.0)
Monocytes Absolute: 0.3 10*3/uL (ref 0.1–1.0)
NEUTROS ABS: 1.5 10*3/uL (ref 1.4–7.7)
Neutrophils Relative %: 36.8 % — ABNORMAL LOW (ref 43.0–77.0)
Platelets: 236 10*3/uL (ref 150.0–400.0)
RBC: 4.72 Mil/uL (ref 3.87–5.11)
RDW: 15 % — AB (ref 11.5–14.6)
WBC: 4 10*3/uL — ABNORMAL LOW (ref 4.5–10.5)

## 2013-03-05 LAB — LDL CHOLESTEROL, DIRECT: Direct LDL: 128.9 mg/dL

## 2013-03-05 LAB — LIPID PANEL
CHOL/HDL RATIO: 3
Cholesterol: 232 mg/dL — ABNORMAL HIGH (ref 0–200)
HDL: 68.6 mg/dL (ref >39.00)
TRIGLYCERIDES: 106 mg/dL (ref 0.0–149.0)
VLDL: 21.2 mg/dL (ref 0.0–40.0)

## 2013-03-05 NOTE — Assessment & Plan Note (Signed)
The patient is not having any symptoms of CHF.  Her exercise tolerance is good.  She has not been having any pedal edema or swelling

## 2013-03-05 NOTE — Assessment & Plan Note (Signed)
Blood pressure today was very elevated.  On repeat by me it was still 190/96.  She has been drinking some herbal tea which she thinks has a lot of caffeine in it.  Her pulse is rapid today as well.  She will avoid caffeine going forward.

## 2013-03-05 NOTE — Assessment & Plan Note (Signed)
We are checking fasting lipids today.  She is not on any statin therapy.  She is controlling her cholesterol level with diet and exercise at this point

## 2013-03-05 NOTE — Patient Instructions (Addendum)
Will obtain labs today and call you with the results ([email or web address])  Your physician recommends that you continue on your current medications as directed. Please refer to the Current Medication list given to you today.  Your physician wants you to follow-up in: 1 year ov/with fasting labs (ok to take your medication) You will receive a reminder letter in the mail two months in advance. If you don't receive a letter, please call our office to schedule the follow-up appointment.   A chest x-ray takes a picture of the organs and structures inside the chest, including the heart, lungs, and blood vessels. This test can show several things, including, whether the heart is enlarges; whether fluid is building up in the lungs; and whether pacemaker / defibrillator leads are still in place. [facility]  Avoid herbal teas and caffeine

## 2013-03-05 NOTE — Progress Notes (Signed)
Wendy DoppFrederica Stanley Date of Birth:  December 01, 1965 [address] [place], [place]  [address] [phone]  Fax   [phone]  HPI: This pleasant 48 year old African American woman is seen for a followup one-year office visit. She has a past history of known aortic valve insufficiency and she was admitted to [facility] in 2002 with flash pulmonary edema. Since then she has been on daily ACE inhibitor and furosemide and has been clinically doing well. She has not been experiencing any chest pain or shortness of breath. She's not been having any orthopnea or paroxysmal nocturnal dyspnea. She has not been aware of any palpitations. She exercises regularly at the gym.  Since last visit she has gained 4 pounds Her last echocardiogram was on 09/28/10 and showed normal ejection fraction of 55-60% with grade 2 diastolic dysfunction and showed mild aortic stenosis with moderate aortic insufficiency.  She has not had a recent chest x-ray to look for heart size.  Her blood pressure and pulse are higher today but she did not take any medicines this morning.   Current Outpatient Prescriptions  Medication Sig Dispense Refill  . aspirin EC 81 MG tablet Take 81 mg by mouth daily.        . furosemide (LASIX) 40 MG tablet Take 1 tablet (40 mg total) by mouth daily.  30 tablet  11  . lisinopril (PRINIVIL,ZESTRIL) 20 MG tablet take 1 tablet by mouth once daily  30 tablet  1  . Multiple Vitamin (MULTIVITAMIN) tablet Take 1 tablet by mouth daily.        . potassium chloride SA (K-DUR,KLOR-CON) 20 MEQ tablet Take 1 tablet (20 mEq total) by mouth daily.  30 tablet  11   No current facility-administered medications for this visit.    No Known Allergies  Patient Active Problem List   Diagnosis Date Noted  . Hypercholesterolemia 12/27/2011  . Aortic valve regurgitation 10/13/2010  . Benign hypertensive heart disease without heart failure 10/13/2010    History  Smoking status  . Never  Smoker   Smokeless tobacco  . Not on file    History  Alcohol Use No    Family History  Problem Relation Age of Onset  . Hypertension Father   . Kidney failure Father   . Mitral valve prolapse Sister   . Mitral valve prolapse Sister     Review of Systems: The patient denies any heat or cold intolerance.  No weight gain or weight loss.  The patient denies headaches or blurry vision.  There is no cough or sputum production.  The patient denies dizziness.  There is no hematuria or hematochezia.  The patient denies any muscle aches or arthritis.  The patient denies any rash.  The patient denies frequent falling or instability.  There is no history of depression or anxiety.  All other systems were reviewed and are negative.   Physical Exam: Filed Vitals:   03/05/13 0902  BP: 190/96  Pulse: 104   the general appearance reveals a well-developed well-nourished slender woman in no distress.The head and neck exam reveals pupils equal and reactive.  Extraocular movements are full.  There is no scleral icterus.  The mouth and pharynx are normal.  The neck is supple.  The carotids reveal no bruits.  The jugular venous pressure is normal.  The  thyroid is not enlarged.  There is no lymphadenopathy.  The chest is clear to percussion and auscultation.  There are no rales or rhonchi.  Expansion of the chest is symmetrical.  The precordium is quiet.  The first heart sound is normal.  The second heart sound is physiologically split.  There is a grade 2/6 decrescendo murmur of aortic insufficiency at the base.  No gallop or rub There is no abnormal lift or heave.  The abdomen is soft and nontender.  The bowel sounds are normal.  The liver and spleen are not enlarged.  There are no abdominal masses.  There are no abdominal bruits.  Extremities reveal good pedal pulses.  There is no phlebitis or edema.  There is no cyanosis or clubbing.  Strength is normal and symmetrical in all extremities.  There is no  lateralizing weakness.  There are no sensory deficits.  The skin is warm and dry.  There is no rash.  EKG shows sinus tachycardia new since previous tracing   Assessment / Plan: The patient is to continue current medication.  She is to avoid caffeine.  We are checking a chest x-ray today.  We are checking lab work today including CBC lipid panel hepatic function panel and basal metabolic panel.  She will check her blood pressure at home and if it remains elevated she will contact us.  Otherwise recheck in one year for followup office visit EKG lipid panel hepatic function panel and basal metabolic panel.

## 2013-03-06 ENCOUNTER — Telehealth: Payer: Self-pay | Admitting: Cardiology

## 2013-03-06 ENCOUNTER — Emergency Department (HOSPITAL_COMMUNITY)
Admission: EM | Admit: 2013-03-06 | Discharge: 2013-03-06 | Disposition: A | Discharge status: Home / Self Care | Payer: PRIVATE HEALTH INSURANCE | Attending: Emergency Medicine | Admitting: Emergency Medicine

## 2013-03-06 DIAGNOSIS — R000 Tachycardia, unspecified: Secondary | ICD-10-CM

## 2013-03-06 DIAGNOSIS — I10 Essential (primary) hypertension: Secondary | ICD-10-CM

## 2013-03-06 DIAGNOSIS — R209 Unspecified disturbances of skin sensation: Secondary | ICD-10-CM | POA: Insufficient documentation

## 2013-03-06 DIAGNOSIS — I498 Other specified cardiac arrhythmias: Secondary | ICD-10-CM | POA: Insufficient documentation

## 2013-03-06 DIAGNOSIS — Z8709 Personal history of other diseases of the respiratory system: Secondary | ICD-10-CM | POA: Insufficient documentation

## 2013-03-06 DIAGNOSIS — Z79899 Other long term (current) drug therapy: Secondary | ICD-10-CM | POA: Insufficient documentation

## 2013-03-06 DIAGNOSIS — Z7982 Long term (current) use of aspirin: Secondary | ICD-10-CM | POA: Insufficient documentation

## 2013-03-06 LAB — CBC WITH DIFFERENTIAL/PLATELET
BASOS ABS: 0 10*3/uL (ref 0.0–0.1)
Basophils Relative: 0 % (ref 0–1)
EOS PCT: 4 % (ref 0–5)
Eosinophils Absolute: 0.2 10*3/uL (ref 0.0–0.7)
HEMATOCRIT: 40.2 % (ref 36.0–46.0)
HEMOGLOBIN: 13.4 g/dL (ref 12.0–15.0)
LYMPHS PCT: 59 % — AB (ref 12–46)
Lymphs Abs: 2.8 10*3/uL (ref 0.7–4.0)
MCH: 27.7 pg (ref 26.0–34.0)
MCHC: 33.3 g/dL (ref 30.0–36.0)
MCV: 83.1 fL (ref 78.0–100.0)
MONO ABS: 0.4 10*3/uL (ref 0.1–1.0)
MONOS PCT: 8 % (ref 3–12)
NEUTROS ABS: 1.4 10*3/uL — AB (ref 1.7–7.7)
Neutrophils Relative %: 30 % — ABNORMAL LOW (ref 43–77)
Platelets: 245 10*3/uL (ref 150–400)
RBC: 4.84 MIL/uL (ref 3.87–5.11)
RDW: 14.3 % (ref 11.5–15.5)
WBC: 4.8 10*3/uL (ref 4.0–10.5)

## 2013-03-06 LAB — POCT I-STAT, CHEM 8
BUN: 13 mg/dL (ref 6–23)
CHLORIDE: 105 meq/L (ref 96–112)
Calcium, Ion: 1.24 mmol/L — ABNORMAL HIGH (ref 1.12–1.23)
Creatinine, Ser: 1 mg/dL (ref 0.50–1.10)
Glucose, Bld: 124 mg/dL — ABNORMAL HIGH (ref 70–99)
HCT: 45 % (ref 36.0–46.0)
Hemoglobin: 15.3 g/dL — ABNORMAL HIGH (ref 12.0–15.0)
POTASSIUM: 3.6 meq/L — AB (ref 3.7–5.3)
SODIUM: 143 meq/L (ref 137–147)
TCO2: 25 mmol/L (ref 0–100)

## 2013-03-06 LAB — T4, FREE: FREE T4: 1.18 ng/dL (ref 0.80–1.80)

## 2013-03-06 LAB — TSH: TSH: 6.519 u[IU]/mL — ABNORMAL HIGH (ref 0.350–4.500)

## 2013-03-06 LAB — TROPONIN I: Troponin I: <0.3 ng/mL (ref <0.30)

## 2013-03-06 MED ORDER — AMLODIPINE BESYLATE 5 MG PO TABS: 5.0000 mg | ORAL_TABLET | Freq: Every day | ORAL | Status: DC

## 2013-03-06 MED ORDER — AMLODIPINE BESYLATE 5 MG PO TABS
5.0000 mg | ORAL_TABLET | Freq: Every day | ORAL | Status: DC
  Administered 2013-03-06: 5 mg via ORAL
  Filled 2013-03-06: qty 1

## 2013-03-06 MED ORDER — SODIUM CHLORIDE 0.9 % IV BOLUS (SEPSIS)
500.0000 mL | Freq: Once | INTRAVENOUS | Status: AC
  Administered 2013-03-06: 500 mL via INTRAVENOUS

## 2013-03-06 NOTE — Telephone Encounter (Signed)
Left message

## 2013-03-06 NOTE — ED Notes (Signed)
Patient states that she went to her primary care today for her routine physical and that her blood pressure was high with her systolic over 200. Her PCP told her that he would monitor it and maybe change some medication. She also made him aware of the palpitations at that time and he attributed it maybe caffeine and the fact that she had not taken her medications for the day. The tingling in her arm woke her from her sleep. She denies having the tingling earlier today when she saw her PCP. She denies nausea, vomiting, or pain.

## 2013-03-06 NOTE — Discharge Instructions (Signed)
You have been give a new antihypertensive medication called Norvasc 5mg    Please take this in the evening on a daily basis Call Dr. Patty Sermons in the morning to discuss this evenings ED visit and to review the result of your thyroid test

## 2013-03-06 NOTE — Telephone Encounter (Signed)
Because of the recent high blood pressures I would agree that she should go on amlodipine 5 mg a day to help with blood pressure control.  I am also anxious to see her chest x-ray results when she gets her x-ray.

## 2013-03-06 NOTE — Telephone Encounter (Signed)
Advised patient of lab results and  Dr. Yevonne Pax recommendations. Patient did say the ED MD advised her to have  Dr. Patty Sermons review her thyroid labs and give her results since results not back. TSH is elevated, will forward to  Dr. Patty Sermons for review. Also going for Xray in am

## 2013-03-06 NOTE — ED Provider Notes (Signed)
CSN: [id number]     Arrival date & time 03/06/13  0123 History   First MD Initiated Contact with Patient 03/06/13 [phone]     Chief Complaint  Patient presents with  . Palpitations  . Tingling   (Consider location/radiation/quality/duration/timing/severity/associated sxs/prior Treatment) HPI Comments: Patient states she was seen by her primary care physician yesterday for her routine physical exam and was noted to have elevated blood pressure.  And she was tachycardic  This is been normal.  He treated successfully with lisinopril and Lasix for the past 10 years.  Her physician wanted her to just monitor this for the next few days, and then will discuss the addition of a second antihypertensive. Tonight.  She fell asleep on the couch, and noticed, when she woke up her left arm with tingling in her heart was beating quite rapidly.  She drove herself to the emergency department for further evaluation She denies any nausea, headache, visual disturbances, chest pain, shortness of breath, swelling in her legs, recent travel, recent illnesses, use of illicit drugs.  Copious amounts of caffeine, over-the-counter cold medications  Patient is a 48 y.o. female presenting with palpitations. The history is provided by the patient.  Palpitations Palpitations quality:  Fast Onset quality:  At rest Timing:  Unable to specify Progression:  Unable to specify Context: not anxiety, not bronchodilators, not caffeine, not hyperventilation, not illicit drugs, not nicotine and not stimulant use   Relieved by:  None tried Worsened by:  Nothing tried Associated symptoms: numbness   Associated symptoms: no chest pain, no nausea, no shortness of breath and no syncope     Past Medical History  Diagnosis Date  . Aortic insufficiency   . Flash pulmonary edema    Past Surgical History  Procedure Laterality Date  . US echocardiography  08/28/2008    EF 55-60%  . US echocardiography  05/06/2004    EF 55-60%   Family  History  Problem Relation Age of Onset  . Hypertension Father   . Kidney failure Father   . Mitral valve prolapse Sister   . Mitral valve prolapse Sister    History  Substance Use Topics  . Smoking status: Never Smoker   . Smokeless tobacco: Not on file  . Alcohol Use: No   OB History   Grav Para Term Preterm Abortions TAB SAB Ect Mult Living                 Review of Systems  Constitutional: Negative for fever and chills.  Respiratory: Negative for shortness of breath.   Cardiovascular: Positive for palpitations. Negative for chest pain, leg swelling and syncope.  Gastrointestinal: Negative for nausea.  Neurological: Positive for numbness.  All other systems reviewed and are negative.    Allergies  Review of patient's allergies indicates no known allergies.  Home Medications   Current Outpatient Rx  Name  Route  Sig  Dispense  Refill  . aspirin EC 81 MG tablet   Oral   Take 81 mg by mouth daily.           . B Complex-C (B-COMPLEX WITH VITAMIN C) tablet   Oral   Take 1 tablet by mouth daily.         . furosemide (LASIX) 40 MG tablet   Oral   Take 40 mg by mouth daily as needed for fluid.         Marland Kitchen lisinopril (PRINIVIL,ZESTRIL) 20 MG tablet   Oral   Take 20 mg by  mouth daily.         . Multiple Vitamin (MULTIVITAMIN) tablet   Oral   Take 1 tablet by mouth every other day.          Marland Kitchen. OVER THE COUNTER MEDICATION   Oral   Take 1 tablet by mouth daily. Bamboo supplement         . potassium chloride SA (K-DUR,KLOR-CON) 20 MEQ tablet   Oral   Take 20 mEq by mouth daily.         Marland Kitchen. VITAMIN D, CHOLECALCIFEROL, PO   Oral   Take 1 capsule by mouth daily.         Marland Kitchen. amLODipine (NORVASC) 5 MG tablet   Oral   Take 1 tablet (5 mg total) by mouth daily.   30 tablet   0    BP 161/63  Pulse 95  Temp(Src) 98.8 F (37.1 C) (Oral)  Resp 20  SpO2 98%  LMP 02/04/2012 Physical Exam  Nursing note and vitals reviewed. Constitutional: She is  oriented to person, place, and time. She appears well-developed and well-nourished. No distress.  HENT:  Head: Normocephalic and atraumatic.  Mouth/Throat: Oropharynx is clear and moist.  Eyes: Pupils are equal, round, and reactive to light.  Neck: Normal range of motion.  Cardiovascular: Regular rhythm, normal heart sounds and intact distal pulses.  Tachycardia present.  Exam reveals no gallop.   Pulmonary/Chest: Effort normal. No respiratory distress.  Musculoskeletal: Normal range of motion. She exhibits no tenderness.  Neurological: She is alert and oriented to person, place, and time.  Skin: Skin is warm and dry. No rash noted. No erythema.    ED Course  Procedures (including critical care time) Labs Review Labs Reviewed  CBC WITH DIFFERENTIAL - Abnormal; Notable for the following:    Neutrophils Relative % 30 (*)    Neutro Abs 1.4 (*)    Lymphocytes Relative 59 (*)    All other components within normal limits  POCT I-STAT, CHEM 8 - Abnormal; Notable for the following:    Potassium 3.6 (*)    Glucose, Bld 124 (*)    Calcium, Ion 1.24 (*)    Hemoglobin 15.3 (*)    All other components within normal limits  TROPONIN I  TSH  T4, FREE   Imaging Review No results found.  EKG Interpretation    Date/Time:  Wednesday March 06 2013 01:33:28 EST Ventricular Rate:  108 PR Interval:  174 QRS Duration: 96 QT Interval:  358 QTC Calculation: 480 R Axis:   22 Text Interpretation:  Sinus tachycardia Left ventricular hypertrophy No significant change since last tracing Confirmed by STEINL  MD, KEVIN ([id number]) on 03/06/2013 1:39:08 AM            MDM   1. Hypertension   2. Sinus tachycardia     Patient was given 1 L fluid, labs were rechecked.  Troponin is negative.  EKG shows sinus tachycardia she was given 5 mg of Norvasc by mouth.  She was continued to be monitored at the time of her discharge.  Her heart rate is 87 and her blood pressure is 164 over 63.  She is feeling  back at her normal state of health.  She will call Dr. Patty SermonsBrackbill in the morning to discuss the addition of Norvasc to her regular medication regime.    Arman FilterGail K Farhiya Rosten, NP 03/06/13 [id number]  Arman FilterGail K Nardos Putnam, NP 03/06/13 [phone]

## 2013-03-06 NOTE — Telephone Encounter (Signed)
Message copied by Burnell Blanks on Wed Mar 06, 2013  6:03 PM ------      Message from: Cassell Clement      Created: Wed Mar 06, 2013  5:02 PM       The blood tests are satisfactory except cholesterol is higher.  The liver tests are fine.  Kidney tests are fine.  She needs to work harder on low cholesterol diet and continue regular aerobic exercise. ------

## 2013-03-06 NOTE — Telephone Encounter (Signed)
Left message to call back  

## 2013-03-06 NOTE — Telephone Encounter (Signed)
Spoke with patient and after her office visit yesterday her blood pressure did go down. She woke up around midnight with her heart beating hard and the numbness and tingling in her arm. Other than that stated she felt fine. Did go to ED and got home around 5:00 am. Is at work now and recently checked her blood pressure and was 160/77, feels fine. Took her regular medications and had the Amlodipine around 3:00 am. Will forward to  Dr. Patty Sermons for review.

## 2013-03-06 NOTE — Telephone Encounter (Signed)
New message         Wendy Stanley had to go to [facility] last night because of bp being higher than it was when she saw dr Patty Sermons yesterday. She would like dr Patty Sermons to look over the tests that were ran at [facility] last night and give her a call.

## 2013-03-07 ENCOUNTER — Ambulatory Visit
Admission: RE | Admit: 2013-03-07 | Discharge: 2013-03-07 | Disposition: A | Discharge status: Home / Self Care | Payer: PRIVATE HEALTH INSURANCE | Source: Ambulatory Visit | Attending: Cardiology | Admitting: Cardiology

## 2013-03-07 DIAGNOSIS — I119 Hypertensive heart disease without heart failure: Secondary | ICD-10-CM

## 2013-03-07 DIAGNOSIS — E7800 Pure hypercholesterolemia, unspecified: Secondary | ICD-10-CM

## 2013-03-07 DIAGNOSIS — I351 Nonrheumatic aortic (valve) insufficiency: Secondary | ICD-10-CM

## 2013-03-07 NOTE — Telephone Encounter (Signed)
Please report. I checked her thyroid functions.  The TSH is borderline high but the F-T4 is very normal so she does not have a thyroid problem. No additional thyroid medicine needed.

## 2013-03-08 MED ORDER — POTASSIUM CHLORIDE CRYS ER 20 MEQ PO TBCR: 20.0000 meq | EXTENDED_RELEASE_TABLET | Freq: Every day | ORAL | Status: DC

## 2013-03-08 MED ORDER — LISINOPRIL 20 MG PO TABS: 20.0000 mg | ORAL_TABLET | Freq: Every day | ORAL | Status: DC

## 2013-03-08 MED ORDER — FUROSEMIDE 40 MG PO TABS: 40.0000 mg | ORAL_TABLET | Freq: Every day | ORAL | Status: DC | PRN

## 2013-03-08 MED ORDER — AMLODIPINE BESYLATE 5 MG PO TABS: 5.0000 mg | ORAL_TABLET | Freq: Every day | ORAL | Status: DC

## 2013-03-08 NOTE — Telephone Encounter (Signed)
Advised patient, she will call back next week with update

## 2013-03-08 NOTE — Telephone Encounter (Signed)
Left message to call back    Notes Recorded by Cassell Clement, MD on 03/07/2013 at 9:39 AM Please report. Chest xray is normal. Heart size is normal. No CHF.

## 2013-03-08 NOTE — Telephone Encounter (Signed)
Patient is returning your call, please call her [phone]. Ask for her and they will page her.

## 2013-03-08 NOTE — Telephone Encounter (Signed)
Follow up     Returned nurses call regarding chest xray results

## 2013-03-10 NOTE — ED Provider Notes (Signed)
Medical screening examination/treatment/procedure(s) were conducted as a shared visit with non-physician practitioner(s) and myself.  I personally evaluated the patient during the encounter.  EKG Interpretation    Date/Time:  Wednesday March 06 2013 01:33:28 EST Ventricular Rate:  108 PR Interval:  174 QRS Duration: 96 QT Interval:  358 QTC Calculation: 480 R Axis:   22 Text Interpretation:  Sinus tachycardia Left ventricular hypertrophy No significant change since last tracing Confirmed by Thorsten Climer  MD, Azalynn Maxim ([id number]) on 03/06/2013 1:39:08 AM            Pt had noted palpitations earlier. No cp. No hx fainting/syncope. Currently feeling improved. Nsr. Labs. Monitor.   Suzi Roots, MD 03/10/13 [id number]

## 2013-05-02 ENCOUNTER — Ambulatory Visit: Payer: PRIVATE HEALTH INSURANCE | Admitting: Cardiology

## 2013-05-14 ENCOUNTER — Ambulatory Visit: Payer: PRIVATE HEALTH INSURANCE | Admitting: Cardiology

## 2013-07-02 ENCOUNTER — Encounter: Payer: Self-pay | Admitting: Physician Assistant

## 2013-07-02 ENCOUNTER — Ambulatory Visit (INDEPENDENT_AMBULATORY_CARE_PROVIDER_SITE_OTHER): Payer: PRIVATE HEALTH INSURANCE | Admitting: Physician Assistant

## 2013-07-02 VITALS — BP 140/80 | HR 99 | Ht 67.0 in | Wt 139.8 lb

## 2013-07-02 DIAGNOSIS — E7800 Pure hypercholesterolemia, unspecified: Secondary | ICD-10-CM

## 2013-07-02 DIAGNOSIS — I119 Hypertensive heart disease without heart failure: Secondary | ICD-10-CM

## 2013-07-02 DIAGNOSIS — I351 Nonrheumatic aortic (valve) insufficiency: Secondary | ICD-10-CM

## 2013-07-02 DIAGNOSIS — I359 Nonrheumatic aortic valve disorder, unspecified: Secondary | ICD-10-CM

## 2013-07-02 NOTE — Progress Notes (Signed)
Cardiology Office Note    Date:  07/02/2013   ID:  Wendy Stanley, DOB May 11, 1965, MRN [id number]  PCP:  No primary provider on file.  Cardiologist:  Dr. Cassell Clement      History of Present Illness: Wendy Stanley is a 48 y.o. female with a hx of aortic valve insufficiency.  She was admitted in 2002 to [facility] with flash pulmonary edema.  She has been controlled with ACEI and Furosemide since that time.  Last seen by Dr. Cassell Clement 03/2013.  BP was elevated and she was to stop drinking an herbal tea that contained a lot of caffeine.  She was then seen in the ED 03/06/13 with HTN and tachycardia.  She was treated with Norvasc 5 mg.  She returns for follow up.  She is doing well.  She stopped drinking her herbal tea and feels better.  The patient denies chest pain, shortness of breath, syncope, orthopnea, PND or significant pedal edema.    Studies:  - Echo (09/28/10):  EF 55% to 60%. Wall motion was normal; Grade 1 diastolic dysfunction, mild aortic stenosis (Mean gradient: 28mm Hg (S). Peak gradient: 31mm Hg). Moderate aortic regurgitation.    Recent Labs: 03/05/2013: ALT 21; Direct LDL 128.9; HDL Cholesterol by NMR 68.60  03/06/2013: Creatinine 1.00; Hemoglobin 15.3*; Potassium 3.6*; TSH 6.519*   Wt Readings from Last 3 Encounters:  03/05/13 138 lb (62.596 kg)  12/27/11 134 lb 12.8 oz (61.145 kg)  10/13/10 145 lb (65.772 kg)     Past Medical History  Diagnosis Date  . Aortic insufficiency   . Flash pulmonary edema     Current Outpatient Prescriptions  Medication Sig Dispense Refill  . amLODipine (NORVASC) 5 MG tablet Take 1 tablet (5 mg total) by mouth daily.  30 tablet  2  . aspirin EC 81 MG tablet Take 81 mg by mouth daily.        . B Complex-C (B-COMPLEX WITH VITAMIN C) tablet Take 1 tablet by mouth daily.      . furosemide (LASIX) 40 MG tablet Take 1 tablet (40 mg total) by mouth daily as needed for fluid.  30 tablet  11  . lisinopril (PRINIVIL,ZESTRIL) 20 MG  tablet Take 1 tablet (20 mg total) by mouth daily.  30 tablet  11  . Multiple Vitamin (MULTIVITAMIN) tablet Take 1 tablet by mouth every other day.       Marland Kitchen OVER THE COUNTER MEDICATION Take 1 tablet by mouth daily. Bamboo supplement      . potassium chloride SA (K-DUR,KLOR-CON) 20 MEQ tablet Take 1 tablet (20 mEq total) by mouth daily.  30 tablet  11  . VITAMIN D, CHOLECALCIFEROL, PO Take 1 capsule by mouth daily.       No current facility-administered medications for this visit.    Allergies:   Review of patient's allergies indicates no known allergies.   Social History:  The patient  reports that she has never smoked. She does not have any smokeless tobacco history on file. She reports that she does not drink alcohol or use illicit drugs.   Family History:  The patient's family history includes Hypertension in her father; Kidney failure in her father; Mitral valve prolapse in her sister and sister.   ROS:  Please see the history of present illness.      All other systems reviewed and negative.   PHYSICAL EXAM: VS:  BP 140/80  Pulse 99  Ht 5\' 7"  (1.702 m)  Wt 139 lb 12.8  oz (63.413 kg)  BMI 21.89 kg/m2 Well nourished, well developed, in no acute distress HEENT: normal Neck: no JVD Cardiac:  normal S1, S2; RRR; 1/2 systolic murmur at RUSB and 2/6 diastolic murmur along LSB at 3rd LICS Lungs:  clear to auscultation bilaterally, no wheezing, rhonchi or rales Abd: soft, nontender, no hepatomegaly Ext: no edema Skin: warm and dry Neuro:  CNs 2-12 intact, no focal abnormalities noted  EKG:  NSR, HR 99, normal axis, repol abnormality, no change from prior tracing.     ASSESSMENT AND PLAN:  1. Benign hypertensive heart disease without heart failure:  Borderline control.  She will continue to monitor. She will try to reduce NaCl (she has been eating too much NaCl).  She will call if BP remains > 140 systolic.  Would consider increasing Lisinopril at that point.   2. Aortic valve  regurgitation:  Stable clinically.  Arrange follow up echo before next appt. 3. Hypercholesterolemia:  Continue with diet and exercise. 4. Disposition:  F/u with Dr. Cassell Clementhomas Brackbill in 12/2013.     Signed, Brynda RimScott Sharon Rubis, PA-C, MHS 07/02/2013 10:04 AM    [facility] [address], [place], [place]  [address] Phone: [phone]) [phone]; Fax: [phone]) [phone]

## 2013-07-02 NOTE — Patient Instructions (Signed)
Your physician has requested that you have an echocardiogram THIS IS TO BE DONE IN NOV 1-2 WEEKS BEFORE DR. BRACKBILL APPT IN NOV. Echocardiography is a painless test that uses sound waves to create images of your heart. It provides your doctor with information about the size and shape of your heart and how well your heart's chambers and valves are working. This procedure takes approximately one hour. There are no restrictions for this procedure.  NO CHANGES WERE MADE TODAY WITH MEDICATIONS  YOU WILL NEED A FOLLOW UP WITH DR. Patty Sermons IN NOV 2015

## 2013-10-16 ENCOUNTER — Other Ambulatory Visit: Payer: Self-pay | Admitting: Cardiology

## 2013-12-18 ENCOUNTER — Other Ambulatory Visit (HOSPITAL_COMMUNITY): Payer: PRIVATE HEALTH INSURANCE

## 2014-01-09 ENCOUNTER — Other Ambulatory Visit: Payer: Self-pay

## 2014-01-09 ENCOUNTER — Ambulatory Visit (HOSPITAL_COMMUNITY): Payer: PRIVATE HEALTH INSURANCE | Attending: Cardiovascular Disease | Admitting: Radiology

## 2014-01-09 DIAGNOSIS — I351 Nonrheumatic aortic (valve) insufficiency: Secondary | ICD-10-CM | POA: Diagnosis not present

## 2014-01-09 NOTE — Progress Notes (Signed)
Echocardiogram performed.  

## 2014-02-05 ENCOUNTER — Telehealth: Payer: Self-pay | Admitting: Cardiology

## 2014-02-05 NOTE — Telephone Encounter (Signed)
Patient scheduled for ov tomorrow, follow up echo

## 2014-02-05 NOTE — Telephone Encounter (Signed)
New msg           Pt returning call.   Please contact pt at [phone] which is work # and have her paged if away from desk.

## 2014-02-06 ENCOUNTER — Ambulatory Visit: Payer: PRIVATE HEALTH INSURANCE | Admitting: Cardiology

## 2014-02-07 ENCOUNTER — Ambulatory Visit (INDEPENDENT_AMBULATORY_CARE_PROVIDER_SITE_OTHER): Payer: PRIVATE HEALTH INSURANCE | Admitting: Cardiology

## 2014-02-07 ENCOUNTER — Encounter: Payer: Self-pay | Admitting: Cardiology

## 2014-02-07 VITALS — BP 190/106 | HR 90 | Ht 67.0 in | Wt 140.0 lb

## 2014-02-07 DIAGNOSIS — I351 Nonrheumatic aortic (valve) insufficiency: Secondary | ICD-10-CM

## 2014-02-07 DIAGNOSIS — I119 Hypertensive heart disease without heart failure: Secondary | ICD-10-CM

## 2014-02-07 DIAGNOSIS — E7800 Pure hypercholesterolemia, unspecified: Secondary | ICD-10-CM

## 2014-02-07 MED ORDER — LISINOPRIL 20 MG PO TABS: 20.0000 mg | ORAL_TABLET | Freq: Every day | ORAL | Status: DC

## 2014-02-07 MED ORDER — AMLODIPINE BESYLATE 5 MG PO TABS: 5.0000 mg | ORAL_TABLET | Freq: Every day | ORAL | Status: DC

## 2014-02-07 MED ORDER — CARVEDILOL 3.125 MG PO TABS: 3.1250 mg | ORAL_TABLET | Freq: Two times a day (BID) | ORAL | Status: DC

## 2014-02-07 NOTE — Patient Instructions (Signed)
RESUME AMLODIPINE 5 MG DAILY   START CARVEDILOL 3.125 MG TWICE A DAY  Your physician recommends that you schedule a follow-up appointment in: 1 month with fasting labs (lp/bmet/hfp)

## 2014-02-07 NOTE — Progress Notes (Signed)
Wendy Stanley Date of Birth:  [birth year] [address] [place], [place]  [address] [phone]  Fax   [phone]  HPI: This pleasant 49 year old African American woman is seen for a followup  office visit. She has a past history of known aortic valve insufficiency and she was admitted to [facility] hospital in 2002 with flash pulmonary edema. Since then she has been on daily ACE inhibitor and furosemide and has been clinically doing well. She has not been experiencing any chest pain or shortness of breath. She's not been having any orthopnea or paroxysmal nocturnal dyspnea. She has not been aware of any palpitations. She exercises regularly at the gym.  Since last visit she has gained 4 pounds . She had a previous echocardiogram on 09/28/10  which showed normal ejection fraction of 55-60% with grade 2 diastolic dysfunction and showed mild aortic stenosis with moderate aortic insufficiency.  She had a routine follow up echocardiogram in December 2015 which showed a drop in her ejection fraction to 45-50% with anteroapical hypokinesis. She still has moderate aortic insufficiency. The patient has had extreme job stress since September.  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 She has not been able to get to the gym for exercise. She has not been having any chest pain or shortness of breath. In June she went to the emergency room with a rapid heartbeat which she attributed to anxiety. Her blood pressure has been running higher.  She inadvertently had stopped taking her amlodipine.    Current Outpatient Prescriptions  Medication Sig Dispense Refill  . amLODipine (NORVASC) 5 MG tablet Take 1 tablet (5 mg total) by mouth daily. 30 tablet 5  . aspirin EC 81 MG tablet Take 81 mg by mouth daily.      . B Complex-C (B-COMPLEX WITH VITAMIN C) tablet Take 1 tablet by mouth daily.    . furosemide (LASIX) 40 MG tablet Take 1 tablet (40 mg total) by mouth daily as needed for fluid. 30 tablet 11    . lisinopril (PRINIVIL,ZESTRIL) 20 MG tablet Take 1 tablet (20 mg total) by mouth daily. 30 tablet 11  . Multiple Vitamin (MULTIVITAMIN) tablet Take 1 tablet by mouth every other day.     Marland Kitchen OVER THE COUNTER MEDICATION Take 1 tablet by mouth daily. Bamboo supplement    . potassium chloride SA (K-DUR,KLOR-CON) 20 MEQ tablet Take 1 tablet (20 mEq total) by mouth daily. 30 tablet 11  . VITAMIN D, CHOLECALCIFEROL, PO Take 1 capsule by mouth daily.    . carvedilol (COREG) 3.125 MG tablet Take 1 tablet (3.125 mg total) by mouth 2 (two) times daily. 60 tablet 5   No current facility-administered medications for this visit.    No Known Allergies  Patient Active Problem List   Diagnosis Date Noted  . Hypercholesterolemia 12/27/2011  . Aortic valve regurgitation 10/13/2010  . Benign hypertensive heart disease without heart failure 10/13/2010    History  Smoking status  . Never Smoker   Smokeless tobacco  . Not on file    History  Alcohol Use No    Family History  Problem Relation Age of Onset  . Hypertension Father   . Kidney failure Father   . Mitral valve prolapse Sister   . Mitral valve prolapse Sister     Review of Systems: The patient denies any heat or cold intolerance.  No weight gain or weight loss.  The patient denies headaches or blurry vision.  There is no cough or  sputum production.  The patient denies dizziness.  There is no hematuria or hematochezia.  The patient denies any muscle aches or arthritis.  The patient denies any rash.  The patient denies frequent falling or instability.  There is no history of depression or anxiety.  All other systems were reviewed and are negative.   Physical Exam: Filed Vitals:   02/07/14 0829  BP: 190/106  Pulse: 90   the general appearance reveals a well-developed well-nourished slender woman in no distress.The head and neck exam reveals pupils equal and reactive.  Extraocular movements are full.  There is no scleral icterus.  The  mouth and pharynx are normal.  The neck is supple.  The carotids reveal no bruits.  The jugular venous pressure is normal.  The  thyroid is not enlarged.  There is no lymphadenopathy.  The chest is clear to percussion and auscultation.  There are no rales or rhonchi.  Expansion of the chest is symmetrical.  The precordium is quiet.  The first heart sound is normal.  The second heart sound is physiologically split.  There is a grade 2/6 decrescendo murmur of aortic insufficiency at the base.  No gallop or rub There is no abnormal lift or heave.  The abdomen is soft and nontender.  The bowel sounds are normal.  The liver and spleen are not enlarged.  There are no abdominal masses.  There are no abdominal bruits.  Extremities reveal good pedal pulses.  There is no phlebitis or edema.  There is no cyanosis or clubbing.  Strength is normal and symmetrical in all extremities.  There is no lateralizing weakness.  There are no sensory deficits.  The skin is warm and dry.  There is no rash.  EKG today shows normal sinus rhythm with heart rate of 90.  There is LVH with repolarization abnormality unchanged.  Since 07/02/13, heart rate is slower   Assessment / Plan: 1.  Aortic valve insufficiency 2.  Poorly controlled hypertension 3.  Left ventricular systolic dysfunction by echocardiogram, new since previous echo. 4.  Hypercholesterolemia 5.  Anxiety  Disposition: We will need to start following her at closer intervals.  I am concerned about her fall in ejection fraction from previous 55-60% down to 45-50% now.  This may be secondary to poorly controlled blood pressure playing a large role. For her blood pressure we will resume amlodipine and continue lisinopril and start carvedilol 3.125 mg twice a day.  I have encouraged her in stress reduction lifestyle including getting back to the gym on a regular basis for aerobic exercise.  We will want to see her again in one month for follow-up office visit to see how her  blood pressure is doing with this change in medication.  We will also get fasting lipid panel hepatic function panel and basal metabolic panel to follow up on her hypercholesterolemia.  We talked in general terms about eventual need for aortic valve replacement.  Unfortunately at her present age she would need to have a mechanical valve.

## 2014-02-13 ENCOUNTER — Ambulatory Visit: Payer: PRIVATE HEALTH INSURANCE | Admitting: Cardiology

## 2014-03-05 ENCOUNTER — Ambulatory Visit: Payer: PRIVATE HEALTH INSURANCE | Admitting: Cardiology

## 2014-03-06 ENCOUNTER — Encounter: Payer: Self-pay | Admitting: Cardiology

## 2014-03-06 ENCOUNTER — Ambulatory Visit (INDEPENDENT_AMBULATORY_CARE_PROVIDER_SITE_OTHER): Payer: PRIVATE HEALTH INSURANCE | Admitting: Cardiology

## 2014-03-06 VITALS — BP 160/86 | HR 77 | Ht 67.0 in | Wt 141.0 lb

## 2014-03-06 DIAGNOSIS — I119 Hypertensive heart disease without heart failure: Secondary | ICD-10-CM

## 2014-03-06 DIAGNOSIS — I351 Nonrheumatic aortic (valve) insufficiency: Secondary | ICD-10-CM

## 2014-03-06 DIAGNOSIS — E7800 Pure hypercholesterolemia, unspecified: Secondary | ICD-10-CM

## 2014-03-06 LAB — HEPATIC FUNCTION PANEL
ALK PHOS: 89 U/L (ref 39–117)
ALT: 24 U/L (ref 0–35)
AST: 27 U/L (ref 0–37)
Albumin: 3.9 g/dL (ref 3.5–5.2)
BILIRUBIN TOTAL: 0.4 mg/dL (ref 0.2–1.2)
Bilirubin, Direct: 0.1 mg/dL (ref 0.0–0.3)
Total Protein: 7.5 g/dL (ref 6.0–8.3)

## 2014-03-06 LAB — BASIC METABOLIC PANEL
BUN: 20 mg/dL (ref 6–23)
CALCIUM: 9.5 mg/dL (ref 8.4–10.5)
CO2: 27 meq/L (ref 19–32)
Chloride: 106 mEq/L (ref 96–112)
Creatinine, Ser: 0.92 mg/dL (ref 0.40–1.20)
GFR: 83.09 mL/min (ref >60.00)
GLUCOSE: 89 mg/dL (ref 70–99)
POTASSIUM: 4.5 meq/L (ref 3.5–5.1)
Sodium: 139 mEq/L (ref 135–145)

## 2014-03-06 LAB — LIPID PANEL
Cholesterol: 195 mg/dL (ref 0–200)
HDL: 60.4 mg/dL (ref >39.00)
LDL CALC: 116 mg/dL — AB (ref 0–99)
NONHDL: 134.6
TRIGLYCERIDES: 93 mg/dL (ref 0.0–149.0)
Total CHOL/HDL Ratio: 3
VLDL: 18.6 mg/dL (ref 0.0–40.0)

## 2014-03-06 MED ORDER — CARVEDILOL 6.25 MG PO TABS: 6.2500 mg | ORAL_TABLET | Freq: Two times a day (BID) | ORAL | Status: DC

## 2014-03-06 NOTE — Patient Instructions (Signed)
Will obtain labs today and call you with the results (LP/BMET/HFP)  INCREASE YOUR CARVEDILOL TO 6.25 MG TWICE A DAY   Your physician recommends that you schedule a follow-up appointment in: 2 MONTH OV/EKG

## 2014-03-06 NOTE — Progress Notes (Signed)
Cardiology Office Note   Date:  03/06/2014   ID:  Wendy Stanley, DOB Feb 23, 1965, MRN [id number]  PCP:  No primary care provider on file.  Cardiologist:   Cassell Clement, MD   No chief complaint on file.     History of Present Illness: Wendy Stanley is a 49 y.o. female who presents for follow-up office visit  This pleasant 49 year old African American woman is seen for a followup office visit. She has a past history of known aortic valve insufficiency and she was admitted to [facility] hospital in 2002 with flash pulmonary edema. Since then she has been on daily ACE inhibitor and furosemide and has been clinically doing well. She has not been experiencing any chest pain or shortness of breath. She's not been having any orthopnea or paroxysmal nocturnal dyspnea. She has not been aware of any palpitations. She exercises regularly at the gym. Since last visit she has gained 4 pounds . She had a previous echocardiogram on 09/28/10 which showed normal ejection fraction of 55-60% with grade 2 diastolic dysfunction and showed mild aortic stenosis with moderate aortic insufficiency. She had a routine follow up echocardiogram in December 2015 which showed a drop in her ejection fraction to 45-50% with anteroapical hypokinesis. She still has moderate aortic insufficiency. When we last saw her about a month ago her blood pressure was poorly controlled and she was under a lot of job stress.  She reports that since we last saw her she has been feeling well.  She has been monitoring her blood pressure at home and it has been in the range of 140/70.  She has been getting to the gym twice a week.  She is not having any shortness of breath or chest pain.  Her stamina has been normal.  She wears a fit bit and she averages 15,000 steps or more daily.  She has not been having any peripheral edema from her amlodipine.  She has not been having any dizzy spells.  She takes furosemide just 2 or 3 times a  week.  Past Medical History  Diagnosis Date  . Aortic insufficiency   . Flash pulmonary edema     Past Surgical History  Procedure Laterality Date  . US echocardiography  08/28/2008    EF 55-60%  . US echocardiography  05/06/2004    EF 55-60%     Current Outpatient Prescriptions  Medication Sig Dispense Refill  . amLODipine (NORVASC) 5 MG tablet Take 1 tablet (5 mg total) by mouth daily. 30 tablet 5  . aspirin EC 81 MG tablet Take 81 mg by mouth daily.      . B Complex-C (B-COMPLEX WITH VITAMIN C) tablet Take 1 tablet by mouth daily.    . carvedilol (COREG) 6.25 MG tablet Take 1 tablet (6.25 mg total) by mouth 2 (two) times daily. 60 tablet 5  . furosemide (LASIX) 40 MG tablet Take 1 tablet (40 mg total) by mouth daily as needed for fluid. 30 tablet 11  . lisinopril (PRINIVIL,ZESTRIL) 20 MG tablet Take 1 tablet (20 mg total) by mouth daily. 30 tablet 11  . Multiple Vitamin (MULTIVITAMIN) tablet Take 1 tablet by mouth every other day.     . potassium chloride SA (K-DUR,KLOR-CON) 20 MEQ tablet Take 1 tablet (20 mEq total) by mouth daily. 30 tablet 11  . VITAMIN D, CHOLECALCIFEROL, PO Take 1 capsule by mouth daily.     No current facility-administered medications for this visit.    Allergies:   Review  of patient's allergies indicates no known allergies.    Social History:  The patient  reports that she has never smoked. She does not have any smokeless tobacco history on file. She reports that she does not drink alcohol or use illicit drugs.   Family History:  The patient's family history includes Hypertension in her father; Kidney failure in her father; Mitral valve prolapse in her sister and sister.    ROS:  Please see the history of present illness.   Otherwise, review of systems are positive for none.   All other systems are reviewed and negative.    PHYSICAL EXAM: VS:  BP 160/86 mmHg  Pulse 77  Ht 5\' 7"  (1.702 m)  Wt 141 lb (63.957 kg)  BMI 22.08 kg/m2 , BMI Body mass  index is 22.08 kg/(m^2). GEN: Well nourished, well developed, in no acute distress HEENT: normal Neck: no JVD, carotid bruits, or masses Cardiac: RRR; there is a grade 2/6 decrescendo murmur of aortic insufficiency at the left sternal edge.  There is no peripheral edema. Respiratory:  clear to auscultation bilaterally, normal work of breathing GI: soft, nontender, nondistended, + BS MS: no deformity or atrophy Skin: warm and dry, no rash Neuro:  Strength and sensation are intact Psych: euthymic mood, full affect   EKG:  EKG is not ordered today.    Recent Labs: No results found for requested labs within last 365 days.    Lipid Panel    Component Value Date/Time   CHOL 232* 03/05/2013 0932   TRIG 106.0 03/05/2013 0932   HDL 68.60 03/05/2013 0932   CHOLHDL 3 03/05/2013 0932   VLDL 21.2 03/05/2013 0932   LDLCALC 106* 09/28/2010 1050   LDLDIRECT 128.9 03/05/2013 0932      Wt Readings from Last 3 Encounters:  03/06/14 141 lb (63.957 kg)  02/07/14 140 lb (63.504 kg)  07/02/13 139 lb 12.8 oz (63.413 kg)      Other studies Reviewed:    ASSESSMENT AND PLAN:  1. Aortic valve insufficiency 2. Essential hypertension, improving control 3. Left ventricular systolic dysfunction by echocardiogram, new since previous echo. 4. Hypercholesterolemia 5. Anxiety, improved.  Current medicines are reviewed at length with the patient today.  The patient does not have concerns regarding medicines.  The following changes have been made:  We are increasing her dose of carvedilol to 6.25 mg twice a day for better blood pressure control.  Labs/ tests ordered today include: Lipid panel, hepatic function panel, and basal metabolic panel   Orders Placed This Encounter  Procedures  . Lipid panel  . Hepatic function panel  . Basic metabolic panel     Disposition:   FU with Dr. Patty Sermons in 2 months for office visit and EKG.  Continue  regular aerobic exercise   Signed, Cassell Clement, MD  03/06/2014 9:43 AM    [facility] [address] [place], [place], [place]  [address] Phone: [phone]; Fax: [phone]

## 2014-03-11 ENCOUNTER — Other Ambulatory Visit: Payer: Self-pay | Admitting: Cardiology

## 2014-06-12 ENCOUNTER — Encounter: Payer: Self-pay | Admitting: Cardiology

## 2014-06-12 ENCOUNTER — Ambulatory Visit (INDEPENDENT_AMBULATORY_CARE_PROVIDER_SITE_OTHER): Payer: PRIVATE HEALTH INSURANCE | Admitting: Cardiology

## 2014-06-12 VITALS — BP 154/62 | HR 81 | Ht 67.0 in | Wt 139.8 lb

## 2014-06-12 DIAGNOSIS — I119 Hypertensive heart disease without heart failure: Secondary | ICD-10-CM | POA: Diagnosis not present

## 2014-06-12 DIAGNOSIS — E7800 Pure hypercholesterolemia, unspecified: Secondary | ICD-10-CM

## 2014-06-12 DIAGNOSIS — I351 Nonrheumatic aortic (valve) insufficiency: Secondary | ICD-10-CM

## 2014-06-12 MED ORDER — CARVEDILOL 12.5 MG PO TABS: 12.5000 mg | ORAL_TABLET | Freq: Two times a day (BID) | ORAL | Status: DC

## 2014-06-12 NOTE — Patient Instructions (Signed)
Medication Instructions:  INCREASE YOUR CARVEDILOL TO 12.5 MG TWICE A DAY  Labwork: NONE  Testing/Procedures: NONE  Follow-Up: Your physician recommends that you schedule a follow-up appointment in: 4 MONTH OV/EKG/BMET

## 2014-06-12 NOTE — Progress Notes (Signed)
Cardiology Office Note   Date:  06/12/2014   ID:  Wendy Stanley, DOB [birth year], MRN [id number]  PCP:  No primary care provider on file.  Cardiologist: Cassell Clement MD  No chief complaint on file.     History of Present Illness: Wendy Stanley is a 49 y.o. female who presents for follow-up office visit.  This pleasant 49 year old [place] American woman is seen for a followup office visit. She has a past history of known aortic valve insufficiency and she was admitted to [facility] long hospital in 2002 with flash pulmonary edema. Since then she has been on daily ACE inhibitor and intermittent Lasix and has been clinically doing well. She has not been experiencing any chest pain or shortness of breath. She's not been having any orthopnea or paroxysmal nocturnal dyspnea. She has not been aware of any palpitations. She exercises regularly at the gym. Since last visit she has lost 2 pounds . She had a previous echocardiogram on 09/28/10 which showed normal ejection fraction of 55-60% with grade 2 diastolic dysfunction and showed mild aortic stenosis with moderate aortic insufficiency. She had a routine follow up echocardiogram in December 2015 which showed a drop in her ejection fraction to 45-50% with anteroapical hypokinesis. She still has moderate aortic insufficiency. When we last saw her about a month ago her blood pressure was poorly controlled and she was under a lot of job stress. She reports that since we last saw her she has been feeling well. She has been monitoring her blood pressure at home and it has been in the range of 135-145 systolic. She has been getting to the gym 3 times a week .  She does a lot of cardio including treadmill and bicycle She is not having any shortness of breath or chest pain. Her stamina has been normal. She wears a fit bit and she averages 15,000 steps or more daily. She has not been having any peripheral edema from her amlodipine. She has  not been having any dizzy spells. She takes furosemide just 2 or 3 times a week. Since last visit she has not had any further episodes of acute dyspnea.  Past Medical History  Diagnosis Date  . Aortic insufficiency   . Flash pulmonary edema     Past Surgical History  Procedure Laterality Date  . US echocardiography  08/28/2008    EF 55-60%  . US echocardiography  05/06/2004    EF 55-60%     Current Outpatient Prescriptions  Medication Sig Dispense Refill  . amLODipine (NORVASC) 5 MG tablet Take 1 tablet (5 mg total) by mouth daily. 30 tablet 5  . aspirin EC 81 MG tablet Take 81 mg by mouth daily.      . B Complex-C (B-COMPLEX WITH VITAMIN C) tablet Take 1 tablet by mouth daily.    . carvedilol (COREG) 12.5 MG tablet Take 1 tablet (12.5 mg total) by mouth 2 (two) times daily. 60 tablet 5  . furosemide (LASIX) 40 MG tablet Take 1 tablet (40 mg total) by mouth daily as needed for fluid. 30 tablet 11  . lisinopril (PRINIVIL,ZESTRIL) 20 MG tablet Take 1 tablet (20 mg total) by mouth daily. 30 tablet 11  . Multiple Vitamin (MULTIVITAMIN) tablet Take 1 tablet by mouth every other day.     . potassium chloride SA (K-DUR,KLOR-CON) 20 MEQ tablet take 1 tablet by mouth once daily 30 tablet 3  . VITAMIN D, CHOLECALCIFEROL, PO Take 1 capsule by mouth daily.  No current facility-administered medications for this visit.    Allergies:   Review of patient's allergies indicates no known allergies.    Social History:  The patient  reports that she has never smoked. She does not have any smokeless tobacco history on file. She reports that she does not drink alcohol or use illicit drugs.   Family History:  The patient's family history includes Hypertension in her father; Kidney failure in her father; Mitral valve prolapse in her sister and sister.    ROS:  Please see the history of present illness.   Otherwise, review of systems are positive for none.   All other systems are reviewed and  negative.    PHYSICAL EXAM: VS:  BP 154/62 mmHg  Pulse 81  Ht  (1.702 m)  Wt 139 lb 12.8 oz (63.413 kg)  BMI 21.89 kg/m2 , BMI Body mass index is 21.89 kg/(m^2). GEN: Well nourished, well developed, in no acute distress HEENT: normal Neck: no JVD, carotid bruits, or masses Cardiac: RRR; there is a grade 3/6 decrescendo murmur of aortic insufficiency loudest at the left sternal edge.  No S3 gallop.  No pericardial rub. Respiratory:  clear to auscultation bilaterally, normal work of breathing GI: soft, nontender, nondistended, + BS MS: no deformity or atrophy Skin: warm and dry, no rash Neuro:  Strength and sensation are intact Psych: euthymic mood, full affect   EKG:  EKG is not ordered today.    Recent Labs: 03/06/2014: ALT 24; BUN 20; Creatinine 0.92; Potassium 4.5; Sodium 139    Lipid Panel    Component Value Date/Time   CHOL 195 03/06/2014 0852   TRIG 93.0 03/06/2014 0852   HDL 60.40 03/06/2014 0852   CHOLHDL 3 03/06/2014 0852   VLDL 18.6 03/06/2014 0852   LDLCALC 116* 03/06/2014 0852   LDLDIRECT 128.9 03/05/2013 0932      Wt Readings from Last 3 Encounters:  06/12/14 139 lb 12.8 oz (63.413 kg)  03/06/14 141 lb (63.957 kg)  02/07/14 140 lb (63.504 kg)        ASSESSMENT AND PLAN:  1. Aortic valve insufficiency, moderate.  Last echocardiogram was 01/09/14 2. Essential hypertension, improving control but still not ideally controlled 3. Left ventricular systolic dysfunction by echocardiogram, new since previous echo. 4. Hypercholesterolemia 5. Anxiety, improved.  Current medicines are reviewed at length with the patient today. The patient does not have concerns regarding medicines.   Current medicines are reviewed at length with the patient today.  The patient does not have concerns regarding medicines.  The following changes have been made:  We are increasing carvedilol up to 12.5 mg twice a day for better systolic blood pressure  control.  Labs/ tests ordered today include:   Orders Placed This Encounter  Procedures  . Basic metabolic panel    Disposition: Return in 4 months for office visit EKG and basal metabolic panel.  Karie Schwalbe MD 06/12/2014 1:45 PM    [facility] [address] [place], [place], [place]  [address] Phone: [phone]; Fax: [phone]

## 2014-10-08 ENCOUNTER — Ambulatory Visit: Payer: PRIVATE HEALTH INSURANCE | Admitting: Cardiology

## 2014-10-18 ENCOUNTER — Other Ambulatory Visit: Payer: Self-pay | Admitting: Cardiology

## 2014-11-17 ENCOUNTER — Other Ambulatory Visit: Payer: Self-pay | Admitting: Cardiology

## 2014-12-15 ENCOUNTER — Encounter: Payer: Self-pay | Admitting: Cardiology

## 2014-12-15 ENCOUNTER — Ambulatory Visit (INDEPENDENT_AMBULATORY_CARE_PROVIDER_SITE_OTHER): Payer: 59 | Admitting: Cardiology

## 2014-12-15 VITALS — BP 148/76 | HR 91 | Ht 67.0 in | Wt 143.8 lb

## 2014-12-15 DIAGNOSIS — I351 Nonrheumatic aortic (valve) insufficiency: Secondary | ICD-10-CM | POA: Diagnosis not present

## 2014-12-15 DIAGNOSIS — I119 Hypertensive heart disease without heart failure: Secondary | ICD-10-CM

## 2014-12-15 MED ORDER — AMLODIPINE BESYLATE 5 MG PO TABS: 5.0000 mg | ORAL_TABLET | Freq: Every day | ORAL | Status: DC

## 2014-12-15 MED ORDER — CARVEDILOL 12.5 MG PO TABS: 12.5000 mg | ORAL_TABLET | Freq: Two times a day (BID) | ORAL | Status: DC

## 2014-12-15 MED ORDER — FUROSEMIDE 40 MG PO TABS: 40.0000 mg | ORAL_TABLET | Freq: Every day | ORAL | Status: DC | PRN

## 2014-12-15 MED ORDER — LISINOPRIL 20 MG PO TABS: 20.0000 mg | ORAL_TABLET | Freq: Every day | ORAL | Status: DC

## 2014-12-15 MED ORDER — POTASSIUM CHLORIDE CRYS ER 20 MEQ PO TBCR: 20.0000 meq | EXTENDED_RELEASE_TABLET | Freq: Every day | ORAL | Status: DC

## 2014-12-15 NOTE — Patient Instructions (Signed)
Medication Instructions:  Your physician recommends that you continue on your current medications as directed. Please refer to the Current Medication list given to you today.  Labwork: none  Testing/Procedures: Your physician has requested that you have an echocardiogram. Echocardiography is a painless test that uses sound waves to create images of your heart. It provides your doctor with information about the size and shape of your heart and how well your heart's chambers and valves are working. This procedure takes approximately one hour. There are no restrictions for this procedure.  Follow-Up: Your physician recommends that you schedule a follow-up appointment in: 4 month ov with Dr Delton See   If you need a refill on your cardiac medications before your next appointment, please call your pharmacy.

## 2014-12-15 NOTE — Progress Notes (Signed)
Cardiology Office Note   Date:  12/15/2014   ID:  Wendy Stanley, DOB 12/15/1965, MRN [id number]  PCP:  No PCP Per Patient  Cardiologist: Cassell Clement MD  Chief Complaint  Patient presents with  . Hypertension      History of Present Illness: Wendy Stanley is a 49 y.o. female who presents for a scheduled follow-up visit. This pleasant 49 year old [place] American woman is seen for a followup office visit. She has a past history of known aortic valve insufficiency and she was admitted to [facility] long hospital in 2002 with flash pulmonary edema. Since then she has been on daily ACE inhibitor and intermittent Lasix and has been clinically doing well. She has not been experiencing any chest pain or shortness of breath. She's not been having any orthopnea or paroxysmal nocturnal dyspnea. She has not been aware of any palpitations. She exercises regularly at the gym. s . She had a previous echocardiogram on 09/28/10 which showed normal ejection fraction of 55-60% with grade 2 diastolic dysfunction and showed mild aortic stenosis with moderate aortic insufficiency. She had a routine follow up echocardiogram in December 2015 which showed a drop in her ejection fraction to 45-50% with anteroapical hypokinesis. She still has moderate aortic insufficiency. When we last saw her earlier this year her blood pressure was poorly controlled and she was under a lot of job stress. She reports that since we last saw her she has been feeling well.  She has been getting to the gym 3 times a week . She does a lot of cardio including treadmill and bicycle She is not having any shortness of breath or chest pain. Her stamina has been normal. She wears a fit bit and she averages 15,000 steps or more daily. She has not been having any peripheral edema from her amlodipine. She has not been having any dizzy spells.  Occasionally she will feel slightly lightheaded after she takes her ACE inhibitor  and her carvedilol in the morning She takes furosemide rarely Since last visit she has not had any further episodes of acute dyspnea.  She sleeps on 2 pillows.  She denies any paroxysmal nocturnal dyspnea.  She is not having any peripheral edema.  She goes to [employer] on a regular basis for exercise.  Past Medical History  Diagnosis Date  . Aortic insufficiency   . Flash pulmonary edema [facility])     Past Surgical History  Procedure Laterality Date  . US echocardiography  08/28/2008    EF 55-60%  . US echocardiography  05/06/2004    EF 55-60%     Current Outpatient Prescriptions  Medication Sig Dispense Refill  . amLODipine (NORVASC) 5 MG tablet Take 1 tablet (5 mg total) by mouth daily. 90 tablet 3  . aspirin EC 81 MG tablet Take 81 mg by mouth daily.      . B Complex-C (B-COMPLEX WITH VITAMIN C) tablet Take 1 tablet by mouth daily.    . carvedilol (COREG) 12.5 MG tablet Take 1 tablet (12.5 mg total) by mouth 2 (two) times daily. 180 tablet 3  . furosemide (LASIX) 40 MG tablet Take 1 tablet (40 mg total) by mouth daily as needed for fluid. 90 tablet 3  . lisinopril (PRINIVIL,ZESTRIL) 20 MG tablet Take 1 tablet (20 mg total) by mouth daily. 90 tablet 3  . Multiple Vitamin (MULTIVITAMIN) tablet Take 1 tablet by mouth every other day.     . potassium chloride SA (K-DUR,KLOR-CON) 20 MEQ tablet Take 1  tablet (20 mEq total) by mouth daily. 90 tablet 3  . VITAMIN D, CHOLECALCIFEROL, PO Take 1 capsule by mouth daily.     No current facility-administered medications for this visit.    Allergies:   Review of patient's allergies indicates no known allergies.    Social History:  The patient  reports that she has never smoked. She does not have any smokeless tobacco history on file. She reports that she does not drink alcohol or use illicit drugs.   Family History:  The patient's family history includes Hypertension in her father; Kidney failure in her father; Mitral valve prolapse in her  sister and sister.    ROS:  Please see the history of present illness.   Otherwise, review of systems are positive for none.   All other systems are reviewed and negative.    PHYSICAL EXAM: VS:  BP 148/76 mmHg  Pulse 91  Ht 5\' 7"  (1.702 m)  Wt 143 lb 12.8 oz (65.227 kg)  BMI 22.52 kg/m2 , BMI Body mass index is 22.52 kg/(m^2). GEN: Well nourished, well developed, in no acute distress HEENT: normal Neck: no JVD, carotid bruits, or masses Cardiac: RRR; grade 2/6 decrescendo murmur of aortic insufficiency at left sternal edge.  No rubs, or gallops,no edema  Respiratory:  clear to auscultation bilaterally, normal work of breathing GI: soft, nontender, nondistended, + BS MS: no deformity or atrophy Skin: warm and dry, no rash Neuro:  Strength and sensation are intact Psych: euthymic mood, full affect   EKG:  EKG is ordered today. The ekg ordered today demonstrates normal sinus rhythm with occasional PVCs.  No voltage for LVH or strain   Recent Labs: 03/06/2014: ALT 24; BUN 20; Creatinine, Ser 0.92; Potassium 4.5; Sodium 139    Lipid Panel    Component Value Date/Time   CHOL 195 03/06/2014 0852   TRIG 93.0 03/06/2014 0852   HDL 60.40 03/06/2014 0852   CHOLHDL 3 03/06/2014 0852   VLDL 18.6 03/06/2014 0852   LDLCALC 116* 03/06/2014 0852   LDLDIRECT 128.9 03/05/2013 0932      Wt Readings from Last 3 Encounters:  12/15/14 143 lb 12.8 oz (65.227 kg)  06/12/14 139 lb 12.8 oz (63.413 kg)  03/06/14 141 lb (63.957 kg)         ASSESSMENT AND PLAN:      1. Aortic valve insufficiency, moderate. Last echocardiogram was 01/09/14 2. Essential hypertension, improving control but still not ideally controlled 3. Left ventricular systolic dysfunction by echocardiogram, new since previous echo. 4. Hypercholesterolemia 5. Anxiety, improved.       1 Current medicines are reviewed at length with the patient today.  The patient does not have concerns regarding  medicines.  The following changes have been made:  no change  Labs/ tests ordered today include:   Orders Placed This Encounter  Procedures  . Basic metabolic panel  . EKG 12-Lead  . ECHOCARDIOGRAM COMPLETE   Disposition: Because she is having some dizziness when she takes the carvedilol and the lisinopril together in the morning, we will separate them.  We will have her take her lisinopril at lunch time. We will have her return for a two-dimensional echocardiogram to be sure that her left ventricular systolic function is remaining stable.  From a clinical standpoint the patient continues to do well with good exercise tolerance.  Following my retirement she will follow-up in 4 months with Dr. Delton See.   Karie Schwalbe MD 12/15/2014 12:45 PM    [facility] [place], [place], [place]  [address] Phone: [phone]; Fax: [phone]

## 2015-01-01 ENCOUNTER — Ambulatory Visit (HOSPITAL_COMMUNITY): Payer: 59 | Attending: Internal Medicine

## 2015-01-01 ENCOUNTER — Other Ambulatory Visit: Payer: Self-pay

## 2015-01-01 ENCOUNTER — Telehealth: Payer: Self-pay | Admitting: Cardiology

## 2015-01-01 ENCOUNTER — Other Ambulatory Visit (INDEPENDENT_AMBULATORY_CARE_PROVIDER_SITE_OTHER): Payer: 59 | Admitting: *Deleted

## 2015-01-01 DIAGNOSIS — I352 Nonrheumatic aortic (valve) stenosis with insufficiency: Secondary | ICD-10-CM | POA: Insufficient documentation

## 2015-01-01 DIAGNOSIS — I10 Essential (primary) hypertension: Secondary | ICD-10-CM | POA: Insufficient documentation

## 2015-01-01 DIAGNOSIS — E785 Hyperlipidemia, unspecified: Secondary | ICD-10-CM | POA: Diagnosis not present

## 2015-01-01 DIAGNOSIS — I119 Hypertensive heart disease without heart failure: Secondary | ICD-10-CM

## 2015-01-01 DIAGNOSIS — I351 Nonrheumatic aortic (valve) insufficiency: Secondary | ICD-10-CM

## 2015-01-01 DIAGNOSIS — I359 Nonrheumatic aortic valve disorder, unspecified: Secondary | ICD-10-CM | POA: Diagnosis present

## 2015-01-01 DIAGNOSIS — I340 Nonrheumatic mitral (valve) insufficiency: Secondary | ICD-10-CM | POA: Diagnosis not present

## 2015-01-01 DIAGNOSIS — I517 Cardiomegaly: Secondary | ICD-10-CM | POA: Insufficient documentation

## 2015-01-01 DIAGNOSIS — I253 Aneurysm of heart: Secondary | ICD-10-CM | POA: Insufficient documentation

## 2015-01-01 LAB — BASIC METABOLIC PANEL
BUN: 15 mg/dL (ref 7–25)
CHLORIDE: 105 mmol/L (ref 98–110)
CO2: 26 mmol/L (ref 20–31)
Calcium: 9.3 mg/dL (ref 8.6–10.4)
Creat: 0.98 mg/dL (ref 0.50–1.05)
GLUCOSE: 99 mg/dL (ref 65–99)
POTASSIUM: 4.7 mmol/L (ref 3.5–5.3)
SODIUM: 139 mmol/L (ref 135–146)

## 2015-01-01 NOTE — Telephone Encounter (Signed)
NEW MESSAGE   *STAT* If patient is at the pharmacy, call can be transferred to refill team.   1. Which medications need to be refilled? (please list name of each medication and dose if known) amlodipine 5mg   2. Which pharmacy/location (including street and city if local pharmacy) is medication to be sent to? [place] [facility]  3. Do they need a 30 day or 90 day supply? Pt didn't say  Pt said that this was the only med that didn't called in with the other ones by [facility]

## 2015-01-01 NOTE — Telephone Encounter (Signed)
Correction: [facility] pharmacy

## 2015-01-01 NOTE — Progress Notes (Signed)
Quick Note:  Please report to patient. The recent labs are stable. Continue same medication and careful diet. ______ 

## 2015-01-01 NOTE — Telephone Encounter (Signed)
Spoke with pharmacist at [facility] and was informed the the medication was ready for pick up on 12/23/14. Tried to reach patient, but was unsuccessful and voicemail was unidentified. Will try again later.

## 2015-01-02 NOTE — Telephone Encounter (Signed)
Spoke with patient and she picked this up last night.

## 2015-01-05 ENCOUNTER — Telehealth: Payer: Self-pay | Admitting: Cardiology

## 2015-01-05 NOTE — Telephone Encounter (Signed)
New message  ° ° °Patient calling for test results.   °

## 2015-01-05 NOTE — Telephone Encounter (Signed)
Informed pt of lab and echo results. Pt verbalized understanding.  

## 2015-01-06 ENCOUNTER — Telehealth: Payer: Self-pay | Admitting: Cardiology

## 2015-01-06 NOTE — Telephone Encounter (Signed)
Left message to call back  

## 2015-01-06 NOTE — Telephone Encounter (Signed)
New message ° ° ° ° ° °Returning a call to Melinda °

## 2015-01-06 NOTE — Telephone Encounter (Signed)
-----   Message from Cassell Clement, MD sent at 01/01/2015  3:12 PM EST ----- Please report to patient.  The recent labs are stable. Continue same medication and careful diet.

## 2015-01-14 NOTE — Telephone Encounter (Signed)
Followed up since did not hear back from patient She did get her lab and echo results already, answered questions she had

## 2015-04-23 ENCOUNTER — Ambulatory Visit: Payer: 59 | Admitting: Cardiology

## 2016-03-16 ENCOUNTER — Other Ambulatory Visit: Payer: Self-pay | Admitting: *Deleted

## 2016-03-16 ENCOUNTER — Telehealth: Payer: Self-pay | Admitting: Cardiology

## 2016-03-16 DIAGNOSIS — I119 Hypertensive heart disease without heart failure: Secondary | ICD-10-CM

## 2016-03-16 MED ORDER — CARVEDILOL 12.5 MG PO TABS: 12.5000 mg | ORAL_TABLET | Freq: Two times a day (BID) | ORAL | 0 refills | Status: DC

## 2016-03-16 MED ORDER — FUROSEMIDE 40 MG PO TABS: 40.0000 mg | ORAL_TABLET | Freq: Every day | ORAL | 0 refills | Status: DC | PRN

## 2016-03-16 MED ORDER — AMLODIPINE BESYLATE 5 MG PO TABS: 5.0000 mg | ORAL_TABLET | Freq: Every day | ORAL | 0 refills | Status: DC

## 2016-03-16 MED ORDER — LISINOPRIL 20 MG PO TABS: 20.0000 mg | ORAL_TABLET | Freq: Every day | ORAL | 0 refills | Status: DC

## 2016-03-16 NOTE — Telephone Encounter (Signed)
New Message    *STAT* If patient is at the pharmacy, call can be transferred to refill team.   1. Which medications need to be refilled? (please list name of each medication and dose if known) lisinopril (PRINIVIL,ZESTRIL) 20 MG tablet carvedilol (COREG) 12.5 MG tablet,  furosemide (LASIX) 40 MG tablet amlopotine  5MG  2. Which pharmacy/location (including street and city if local pharmacy) is medication to be sent to?[facility] - [place] - [address]  3. Do they need a 30 day or 90 day supply? 90 day  Pt scheduled to see Dr. Delton See March 5th

## 2016-04-04 ENCOUNTER — Encounter (INDEPENDENT_AMBULATORY_CARE_PROVIDER_SITE_OTHER): Payer: Self-pay

## 2016-04-04 ENCOUNTER — Encounter: Payer: Self-pay | Admitting: Cardiology

## 2016-04-04 ENCOUNTER — Ambulatory Visit (INDEPENDENT_AMBULATORY_CARE_PROVIDER_SITE_OTHER): Payer: 59 | Admitting: Cardiology

## 2016-04-04 VITALS — BP 126/74 | HR 85 | Ht 67.0 in | Wt 151.0 lb

## 2016-04-04 DIAGNOSIS — I119 Hypertensive heart disease without heart failure: Secondary | ICD-10-CM

## 2016-04-04 DIAGNOSIS — E7800 Pure hypercholesterolemia, unspecified: Secondary | ICD-10-CM | POA: Diagnosis not present

## 2016-04-04 DIAGNOSIS — I428 Other cardiomyopathies: Secondary | ICD-10-CM | POA: Diagnosis not present

## 2016-04-04 DIAGNOSIS — I351 Nonrheumatic aortic (valve) insufficiency: Secondary | ICD-10-CM | POA: Diagnosis not present

## 2016-04-04 MED ORDER — CARVEDILOL 3.125 MG PO TABS: 3.1250 mg | ORAL_TABLET | Freq: Two times a day (BID) | ORAL | 3 refills | Status: DC

## 2016-04-04 NOTE — Progress Notes (Signed)
Cardiology Office Note    Date:  04/04/2016   ID:  Wendy [place], DOB [birth year], MRN [id number]  PCP:  No PCP Per Patient  Cardiologist:  Dr Patty Sermons --> Tobias Alexander, MD   No chief complaint on file.   History of Present Illness:  Wendy [place] is a 51 y.o. female with h/o aortic valve insufficiency and she was admitted to [facility] in 2002 with flash pulmonary edema. Since then she has been on daily ACE inhibitor and intermittent Lasix and has been clinically doing well. She has not been experiencing any chest pain or shortness of breath. She's not been having any orthopnea or paroxysmal nocturnal dyspnea. She has not been aware of any palpitations. She exercises regularly at the gym. She had a previous echocardiogram on 09/28/10 which showed normal ejection fraction of 55-60% with grade 2 diastolic dysfunction and showed mild aortic stenosis with moderate aortic insufficiency. She had a routine follow up echocardiogram in Nov 2016 which showed a drop in her ejection fraction to 45-50% with anteroapical hypokinesis. She still has moderate aortic insufficiency.  04/04/2016, this is one year follow-up in first time I'm meeting [place]. He states that she feels great she exercises on daily basis, she works as a Production designer, theatre/television/film at [employer], she makes 22,000 steps a day and denies any chest pain, shortness of breath she is not aware of any palpitations she denies any dizziness or syncope. She has no lower extremity edema and no claudications.   Past Medical History:  Diagnosis Date  . Aortic insufficiency   . Flash pulmonary edema [facility])     Past Surgical History:  Procedure Laterality Date  . US ECHOCARDIOGRAPHY  08/28/2008   EF 55-60%  . US ECHOCARDIOGRAPHY  05/06/2004   EF 55-60%    Current Medications: Outpatient Medications Prior to Visit  Medication Sig Dispense Refill  . amLODipine (NORVASC) 5 MG tablet Take 1 tablet (5 mg total) by mouth daily. *Please  keep 04/04/16 appointment for further refills* 90 tablet 0  . aspirin EC 81 MG tablet Take 81 mg by mouth daily.      . B Complex-C (B-COMPLEX WITH VITAMIN C) tablet Take 1 tablet by mouth daily.    . furosemide (LASIX) 40 MG tablet Take 1 tablet (40 mg total) by mouth daily as needed for fluid. *Please keep 04/04/16 appointment for further refills* 90 tablet 0  . lisinopril (PRINIVIL,ZESTRIL) 20 MG tablet Take 1 tablet (20 mg total) by mouth daily. *Please keep 04/04/16 appointment for further refills* 90 tablet 0  . Multiple Vitamin (MULTIVITAMIN) tablet Take 1 tablet by mouth every other day.     . potassium chloride SA (K-DUR,KLOR-CON) 20 MEQ tablet Take 1 tablet (20 mEq total) by mouth daily. 90 tablet 3  . VITAMIN D, CHOLECALCIFEROL, PO Take 1 capsule by mouth daily.    . carvedilol (COREG) 12.5 MG tablet Take 1 tablet (12.5 mg total) by mouth 2 (two) times daily. *Please keep 04/04/16 appointment for further refills* 180 tablet 0   No facility-administered medications prior to visit.      Allergies:   Patient has no known allergies.   Social History   Social History  . Marital status: Single    Spouse name: N/A  . Number of children: N/A  . Years of education: N/A   Social History Main Topics  . Smoking status: Never Smoker  . Smokeless tobacco: Never Used  . Alcohol use No  . Drug use: No  .  Sexual activity: Not Asked   Other Topics Concern  . None   Social History Narrative  . None     Family History:  The patient's family history includes Hypertension in her father; Kidney failure in her father; Mitral valve prolapse in her sister and sister.   ROS:   Please see the history of present illness.    ROS All other systems reviewed and are negative.   PHYSICAL EXAM:   VS:  BP 126/74   Pulse 85   Ht 5\' 7"  (1.702 m)   Wt 151 lb (68.5 kg)   BMI 23.65 kg/m    GEN: Well nourished, well developed, in no acute distress  HEENT: normal  Neck: no JVD, carotid bruits, or  masses Cardiac: RRR; 3/6 diastolic murmur at the left upper sternal border, rubs, or gallops,no edema  Respiratory:  clear to auscultation bilaterally, normal work of breathing GI: soft, nontender, nondistended, + BS Wendy: no deformity or atrophy  Skin: warm and dry, no rash Neuro:  Alert and Oriented x 3, Strength and sensation are intact Psych: euthymic mood, full affect  Wt Readings from Last 3 Encounters:  04/04/16 151 lb (68.5 kg)  12/15/14 143 lb 12.8 oz (65.2 kg)  06/12/14 139 lb 12.8 oz (63.4 kg)      Studies/Labs Reviewed:   EKG:  EKG is ordered today.  The ekg ordered today demonstrates Sinus rhythm with PVCs, nonspecific ST-T wave abnormalities, his EKG is unchanged from prior.  Recent Labs: No results found for requested labs within last 8760 hours.   Lipid Panel    Component Value Date/Time   CHOL 195 03/06/2014 0852   TRIG 93.0 03/06/2014 0852   HDL 60.40 03/06/2014 0852   CHOLHDL 3 03/06/2014 0852   VLDL 18.6 03/06/2014 0852   LDLCALC 116 (H) 03/06/2014 0852   LDLDIRECT 128.9 03/05/2013 0932    Additional studies/ records that were reviewed today include:  TTE: 01/2015 - Left ventricle: The cavity size was normal. Wall thickness was   increased in a pattern of mild LVH. LV apical false tendon.   Systolic function was mildly reduced. The estimated ejection   fraction was in the range of 45% to 50%. Diffuse hypokinesis.   Doppler parameters are consistent with abnormal left ventricular   relaxation (grade 1 diastolic dysfunction). The E/e&' ratio is   between 8-15, suggesting indeterminate LV filling pressure. - Aortic valve: Trileaflet; mildly calcified leaflets. Borderline   mild stenosis. There was moderate regurgitation. Mean gradient   (S): 9 mm Hg. Peak gradient (S): 16 mm Hg. Valve area (VTI): 2.36   cm^2. Valve area (Vmax): 2.13 cm^2. Valve area (Vmean): 2.22   cm^2. - Mitral valve: Mildly thickened leaflets . There was mild   regurgitation. -  Left atrium: The atrium was normal in size. - Atrial septum: Aneurysmal interatrial septum.  Impressions:  - Compared to the prior study in 12/2013, there is no significant   change. There is borderline mild aortic stenosis, primarily due   to fusion of the base of the 3 leaflets. There is moderate   central AI.   ASSESSMENT:    1. Aortic valve insufficiency, etiology of cardiac valve disease unspecified   2. Hypercholesterolemia   3. Benign hypertensive heart disease without heart failure   4. Valvular cardiomyopathy (HCC)      PLAN:  In order of problems listed above:  1. She is completely asymptomatic, however her LVEF has dropped on the last echo in 2016 and  her aortic regurgitation was moderate. A repeat echocardiogram to reevaluate her LVEF, LV size, and degree of aortic regurgitation. 2. Her blood pressure is well controlled. 3. She has frequent PVCs on her EKG however she's not aware of it she denies any palpitations or syncope. She admits to not taking carvedilol as that makes her dizzy. 12.5 mg by mouth twice a day might be too high dose for her, will decrease to 3.125 mg by mouth twice a day. 4. We'll recheck her lipids, CMP, TSH and CBC.  If stable findings on echocardiogram we will follow in one year.  Medication Adjustments/Labs and Tests Ordered: Current medicines are reviewed at length with the patient today.  Concerns regarding medicines are outlined above.  Medication changes, Labs and Tests ordered today are listed in the Patient Instructions below. Patient Instructions  Medication Instructions:   DECREASE YOUR CARVEDILOL TO 3.125 MG TWICE DAILY    Labwork:  IN THE NEAR FUTURE, SAME DAY AS YOUR ECHO APPOINTMENT TO CHECK A ---CMET, CBC W DIFF, TSH, AND LIPIDS---PLEASE COME FASTING TO THIS LAB APPOINTMENT    Testing/Procedures:  Your physician has requested that you have an echocardiogram. Echocardiography is a painless test that uses sound waves to  create images of your heart. It provides your doctor with information about the size and shape of your heart and how well your heart's chambers and valves are working. This procedure takes approximately one hour. There are no restrictions for this procedure.  SCHEDULE YOUR LAB APPOINTMENT SAME DAY AS YOUR ECHO     Follow-Up:  Your physician wants you to follow-up in: ONE YEAR WITH DR Johnell Comings will receive a reminder letter in the mail two months in advance. If you don't receive a letter, please call our office to schedule the follow-up appointment.      If you need a refill on your cardiac medications before your next appointment, please call your pharmacy.      Signed, Tobias [place], MD  04/04/2016 1:14 PM    [facility] [address] [place], [place], [place]  [address] Phone: [phone]; Fax: [phone]

## 2016-04-04 NOTE — Patient Instructions (Signed)
Medication Instructions:   DECREASE YOUR CARVEDILOL TO 3.125 MG TWICE DAILY    Labwork:  IN THE NEAR FUTURE, SAME DAY AS YOUR ECHO APPOINTMENT TO CHECK A ---CMET, CBC W DIFF, TSH, AND LIPIDS---PLEASE COME FASTING TO THIS LAB APPOINTMENT    Testing/Procedures:  Your physician has requested that you have an echocardiogram. Echocardiography is a painless test that uses sound waves to create images of your heart. It provides your doctor with information about the size and shape of your heart and how well your heart's chambers and valves are working. This procedure takes approximately one hour. There are no restrictions for this procedure.  SCHEDULE YOUR LAB APPOINTMENT SAME DAY AS YOUR ECHO     Follow-Up:  Your physician wants you to follow-up in: ONE YEAR WITH DR Johnell Comings will receive a reminder letter in the mail two months in advance. If you don't receive a letter, please call our office to schedule the follow-up appointment.      If you need a refill on your cardiac medications before your next appointment, please call your pharmacy.

## 2016-04-21 ENCOUNTER — Ambulatory Visit (HOSPITAL_COMMUNITY): Payer: 59

## 2016-04-21 ENCOUNTER — Other Ambulatory Visit: Payer: 59 | Admitting: *Deleted

## 2016-04-21 DIAGNOSIS — I351 Nonrheumatic aortic (valve) insufficiency: Secondary | ICD-10-CM

## 2016-04-21 DIAGNOSIS — E7800 Pure hypercholesterolemia, unspecified: Secondary | ICD-10-CM

## 2016-04-21 LAB — COMPREHENSIVE METABOLIC PANEL
ALT: 17 IU/L (ref 0–32)
AST: 27 IU/L (ref 0–40)
Albumin/Globulin Ratio: 1.1 — ABNORMAL LOW (ref 1.2–2.2)
Albumin: 4 g/dL (ref 3.5–5.5)
Alkaline Phosphatase: 88 IU/L (ref 39–117)
BUN/Creatinine Ratio: 18 (ref 9–23)
BUN: 15 mg/dL (ref 6–24)
Bilirubin Total: 0.4 mg/dL (ref 0.0–1.2)
CO2: 23 mmol/L (ref 18–29)
Calcium: 9.7 mg/dL (ref 8.7–10.2)
Chloride: 103 mmol/L (ref 96–106)
Creatinine, Ser: 0.84 mg/dL (ref 0.57–1.00)
GFR calc Af Amer: 92 mL/min/{1.73_m2} (ref >59)
GFR calc non Af Amer: 80 mL/min/{1.73_m2} (ref >59)
Globulin, Total: 3.5 g/dL (ref 1.5–4.5)
Glucose: 102 mg/dL — ABNORMAL HIGH (ref 65–99)
Potassium: 4.9 mmol/L (ref 3.5–5.2)
Sodium: 141 mmol/L (ref 134–144)
Total Protein: 7.5 g/dL (ref 6.0–8.5)

## 2016-04-21 LAB — CBC WITH DIFFERENTIAL/PLATELET
Basophils Absolute: 0 10*3/uL (ref 0.0–0.2)
Basos: 0 %
EOS (ABSOLUTE): 0.2 10*3/uL (ref 0.0–0.4)
Eos: 7 %
Hematocrit: 38.9 % (ref 34.0–46.6)
Hemoglobin: 12.9 g/dL (ref 11.1–15.9)
Immature Grans (Abs): 0 10*3/uL (ref 0.0–0.1)
Immature Granulocytes: 0 %
Lymphocytes Absolute: 1.6 10*3/uL (ref 0.7–3.1)
Lymphs: 47 %
MCH: 27.6 pg (ref 26.6–33.0)
MCHC: 33.2 g/dL (ref 31.5–35.7)
MCV: 83 fL (ref 79–97)
Monocytes Absolute: 0.4 10*3/uL (ref 0.1–0.9)
Monocytes: 12 %
Neutrophils Absolute: 1.2 10*3/uL — ABNORMAL LOW (ref 1.4–7.0)
Neutrophils: 34 %
Platelets: 253 10*3/uL (ref 150–379)
RBC: 4.68 x10E6/uL (ref 3.77–5.28)
RDW: 14.9 % (ref 12.3–15.4)
WBC: 3.3 10*3/uL — ABNORMAL LOW (ref 3.4–10.8)

## 2016-04-21 LAB — LIPID PANEL
Chol/HDL Ratio: 3.2 ratio units (ref 0.0–4.4)
Cholesterol, Total: 211 mg/dL — ABNORMAL HIGH (ref 100–199)
HDL: 65 mg/dL (ref >39)
LDL Calculated: 129 mg/dL — ABNORMAL HIGH (ref 0–99)
Triglycerides: 83 mg/dL (ref 0–149)
VLDL Cholesterol Cal: 17 mg/dL (ref 5–40)

## 2016-04-21 LAB — TSH: TSH: 2.7 u[IU]/mL (ref 0.450–4.500)

## 2016-04-22 ENCOUNTER — Encounter: Payer: Self-pay | Admitting: *Deleted

## 2016-04-22 ENCOUNTER — Telehealth: Payer: Self-pay | Admitting: *Deleted

## 2016-04-22 MED ORDER — ROSUVASTATIN CALCIUM 5 MG PO TABS: 5.0000 mg | ORAL_TABLET | ORAL | 2 refills | Status: DC

## 2016-04-22 NOTE — Telephone Encounter (Signed)
Notified the pt that per Dr Delton See, all of her labs are normal, except LDL that is worsening, previously 116 now 129, she recommends a very low dose rosuvastatin 5 mg po 3x/week, but if she refuses, then she would suggest red yeast rice 600 mg po daily.   Per the pt, she states that she will take the rosuvastatin 5 mg po 3 x weekly.  Instructed her to take this on M,W,F. Confirmed the pharmacy of choice with the pt.  Pt requested a copy of her labs to be mailed to her current mailing address confirmed.  Pt verbalized understanding and agrees with this plan.

## 2016-04-22 NOTE — Telephone Encounter (Signed)
-----   Message from Lars Masson, MD sent at 04/21/2016  5:26 PM EDT ----- All of her labs are normal, except LDL that is worsening, previously 116 now 129, I would recommend a very low dose rosuvastatin 5 mg po 3x/week. IF she refuses, I would suggest red yeast rice 600 mg po daily.

## 2016-05-04 ENCOUNTER — Ambulatory Visit (HOSPITAL_COMMUNITY): Payer: 59 | Attending: Internal Medicine

## 2016-05-04 ENCOUNTER — Other Ambulatory Visit: Payer: Self-pay

## 2016-05-04 DIAGNOSIS — I351 Nonrheumatic aortic (valve) insufficiency: Secondary | ICD-10-CM

## 2016-05-04 DIAGNOSIS — I352 Nonrheumatic aortic (valve) stenosis with insufficiency: Secondary | ICD-10-CM | POA: Diagnosis not present

## 2016-05-04 DIAGNOSIS — I517 Cardiomegaly: Secondary | ICD-10-CM | POA: Insufficient documentation

## 2016-05-04 LAB — ECHOCARDIOGRAM COMPLETE
AO mean calculated velocity dopler: 172 cm/s
AV Mean grad: 14 mmHg
AV Peak grad: 26 mmHg
AV VEL mean LVOT/AV: 0.32
AV pk vel: 256 cm/s
Ao pk vel: 0.32 m/s
E decel time: 239 msec
E/e' ratio: 11.02
FS: 37 % (ref 28–44)
IVS/LV PW RATIO, ED: 1.01
LA ID, A-P, ES: 33 mm
LA diam end sys: 33 mm
LA diam index: 1.83 cm/m2
LA vol A4C: 52 ml
LA vol index: 25.5 mL/m2
LA vol: 46 mL
LV E/e' medial: 11.02
LV E/e'average: 11.02
LV PW d: 9.24 mm — AB (ref 0.6–1.1)
LV e' LATERAL: 8.11 cm/s
LVOT VTI: 12.6 cm
LVOT peak VTI: 0.25 cm
LVOT peak grad rest: 3 mmHg
LVOT peak vel: 81 cm/s
MV Dec: 239
MV Peak grad: 3 mmHg
MV pk A vel: 136 m/s
MV pk E vel: 89.4 m/s
P 1/2 time: 345 ms
TDI e' lateral: 8.11
TDI e' medial: 7.43
VTI: 50.9 cm

## 2016-05-05 ENCOUNTER — Telehealth: Payer: Self-pay | Admitting: Cardiology

## 2016-05-05 NOTE — Telephone Encounter (Signed)
Patient made aware of results. Patient verbalizes understanding.  

## 2016-05-05 NOTE — Telephone Encounter (Signed)
-----   Message from Lars Masson, MD sent at 05/05/2016  8:19 AM EDT ----- Compared to a prior study in 2016, there is now mild aortic stenosis, moderate AI, mild LVH and the LVEF has improved to60-65%.

## 2016-05-05 NOTE — Telephone Encounter (Signed)
Patient is returning your call for results, thanks.

## 2016-08-08 ENCOUNTER — Other Ambulatory Visit: Payer: Self-pay | Admitting: Cardiology

## 2016-08-17 ENCOUNTER — Telehealth: Payer: Self-pay | Admitting: Cardiology

## 2016-08-17 DIAGNOSIS — E7800 Pure hypercholesterolemia, unspecified: Secondary | ICD-10-CM

## 2016-08-17 DIAGNOSIS — I351 Nonrheumatic aortic (valve) insufficiency: Secondary | ICD-10-CM

## 2016-08-17 MED ORDER — POTASSIUM CHLORIDE CRYS ER 20 MEQ PO TBCR: 20.0000 meq | EXTENDED_RELEASE_TABLET | Freq: Every day | ORAL | 3 refills | Status: DC

## 2016-08-17 MED ORDER — CARVEDILOL 3.125 MG PO TABS: 3.1250 mg | ORAL_TABLET | Freq: Two times a day (BID) | ORAL | 3 refills | Status: DC

## 2016-08-17 MED ORDER — ROSUVASTATIN CALCIUM 5 MG PO TABS: 5.0000 mg | ORAL_TABLET | ORAL | 3 refills | Status: DC

## 2016-08-17 MED ORDER — FUROSEMIDE 40 MG PO TABS: ORAL_TABLET | ORAL | 3 refills | Status: DC

## 2016-08-17 MED ORDER — AMLODIPINE BESYLATE 5 MG PO TABS: 5.0000 mg | ORAL_TABLET | Freq: Every day | ORAL | 3 refills | Status: DC

## 2016-08-17 MED ORDER — LISINOPRIL 20 MG PO TABS
20.0000 mg | ORAL_TABLET | Freq: Every day | ORAL | 2 refills | Status: DC
Start: 2016-08-17 — End: 2017-07-27

## 2016-08-17 NOTE — Telephone Encounter (Signed)
Pt calling for refills of her cardiac meds.  Pt states that [facility] on [address] states that she has no further refills.  In EPIC, pt has several refills of her cardiac meds at that pharmacy location.  Pt is compliant with OV follow-ups.  Will resend in refills to this pharmacy for pt to pick up.  Pt verbalized understanding and gracious for all the assistance provided.

## 2016-08-17 NOTE — Telephone Encounter (Signed)
New message    Pt is calling asking for a call back. She has a question about her medication.

## 2017-07-27 ENCOUNTER — Other Ambulatory Visit: Payer: Self-pay | Admitting: *Deleted

## 2017-07-27 ENCOUNTER — Telehealth: Payer: Self-pay | Admitting: Cardiology

## 2017-07-27 DIAGNOSIS — E7800 Pure hypercholesterolemia, unspecified: Secondary | ICD-10-CM

## 2017-07-27 DIAGNOSIS — I351 Nonrheumatic aortic (valve) insufficiency: Secondary | ICD-10-CM

## 2017-07-27 MED ORDER — LISINOPRIL 20 MG PO TABS: 20.0000 mg | ORAL_TABLET | Freq: Every day | ORAL | 0 refills | Status: DC

## 2017-07-27 MED ORDER — CARVEDILOL 3.125 MG PO TABS: 3.1250 mg | ORAL_TABLET | Freq: Two times a day (BID) | ORAL | 0 refills | Status: DC

## 2017-07-27 NOTE — Telephone Encounter (Signed)
New message     *STAT* If patient is at the pharmacy, call can be transferred to refill team.   1. Which medications need to be refilled? (please list name of each medication and dose if known) lisinopril (PRINIVIL,ZESTRIL) 20 MG tablet and carvedilol (COREG) 3.125 MG tablet  2. Which pharmacy/location (including street and city if local pharmacy) is medication to be sent to?[facility] [phone] - [place], [place] - [address]  3. Do they need a 30 day or 90 day supply? 90

## 2017-09-13 ENCOUNTER — Ambulatory Visit: Payer: 59 | Admitting: Physician Assistant

## 2017-10-06 ENCOUNTER — Encounter: Payer: Self-pay | Admitting: Physician Assistant

## 2017-10-06 NOTE — Progress Notes (Signed)
Cardiology Office Note    Date:  10/09/2017  ID:  Wendy [place], DOB 1965-03-13, MRN [id number] PCP:  Patient, No Pcp Per  Cardiologist:  Tobias [place], MD   Chief Complaint: f/u valve disease  History of Present Illness:  Wendy [place] is a 52 y.o. 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 She was admitted to [facility] in 2002 with flash pulmonary edema. Since then she has been on ACEI, Lasix and beta blocker and doing well. Prior echo 8/2012showed normal ejection fraction of 55-60% with grade 2 diastolic dysfunction and showed mild aortic stenosis with moderate aortic insufficiency. Echoes in 2015-2016 showed EF in the 45-50% range. This was felt possibly related to poorly controlled blood pressure, so meds were titrated. F/u echo 05/2016 showed mild LVH, EF 60-65%, moderate AI, mild AS. Last labs 03/2016 showed LDL 129, normal TSH, WBC 3.3, Hgb 12.9, Cr 0.84, K 4.9; TSH 6.5 in 2015 with normal fT4.  She returns for follow-up doing great. She continues to remain very active, tracking her walking on a fitbit. She has put on about 10lb which she attributes to raspberry sherbert. She denies CP, SOB, palpitations, syncope, or edema. She takes Lasix a few times a week - not for any specific symptoms, just to keep up with regular dosing.   Past Medical History:  Diagnosis Date  . Aortic insufficiency   . Aortic valve regurgitation 10/13/2010  . Flash pulmonary edema (HCC) 2012  . Frequent PVCs   . Hypercholesterolemia 12/27/2011  . Mild aortic stenosis   . Valvular cardiomyopathy [facility])     Past Surgical History:  Procedure Laterality Date  . US ECHOCARDIOGRAPHY  08/28/2008   EF 55-60%  . US ECHOCARDIOGRAPHY  05/06/2004   EF 55-60%    Current Medications: Current Meds  Medication Sig  . amLODipine (NORVASC) 5 MG tablet Take 1 tablet (5 mg total) by mouth daily.  Marland Kitchen aspirin EC  81 MG tablet Take 81 mg by mouth daily.    . B Complex-C (B-COMPLEX WITH VITAMIN C) tablet Take 1 tablet by mouth daily.  . carvedilol (COREG) 3.125 MG tablet Take 1 tablet (3.125 mg total) by mouth 2 (two) times daily with a meal. Please keep 09/13/17 appointment for further refills  . furosemide (LASIX) 40 MG tablet take 1 tablet by mouth once daily if needed for fluid retention  . lisinopril (PRINIVIL,ZESTRIL) 20 MG tablet Take 1 tablet (20 mg total) by mouth daily. Please keep 09/13/17 appointment for further refills  . Multiple Vitamin (MULTIVITAMIN) tablet Take 1 tablet by mouth every other day.   . potassium chloride SA (K-DUR,KLOR-CON) 20 MEQ tablet Take 1 tablet (20 mEq total) by mouth daily.  Marland Kitchen VITAMIN D, CHOLECALCIFEROL, PO Take 1 capsule by mouth daily.      Allergies:   Patient has no known allergies.   Social History   Socioeconomic History  . Marital status: Single    Spouse name: Not on file  . Number of children: Not on file  . Years of education: Not on file  . Highest education level: Not on file  Occupational History  . Not on file  Social Needs  . Financial resource strain: Not on file  . Food insecurity:    Worry: Not on file    Inability: Not on file  . Transportation needs:    Medical: Not on file    Non-medical: Not on file  Tobacco Use  . Smoking status: Never Smoker  . Smokeless tobacco: Never Used  Substance and Sexual Activity  . Alcohol use: No  . Drug use: No  . Sexual activity: Not on file  Lifestyle  . Physical activity:    Days per week: Not on file    Minutes per session: Not on file  . Stress: Not on file  Relationships  . Social connections:    Talks on phone: Not on file    Gets together: Not on file    Attends religious service: Not on file    Active member of club or organization: Not on file    Attends meetings of clubs or organizations: Not on file    Relationship status: Not on file  Other Topics Concern  . Not on file    Social History Narrative  . Not on file     Family History:  The patient's family history includes Hypertension in her father; Kidney failure in her father; Mitral valve prolapse in her sister and sister.  ROS:   Please see the history of present illness.  All other systems are reviewed and otherwise negative.    PHYSICAL EXAM:   VS:  BP (!) 144/80   Pulse 90   Ht 5\' 7"  (1.702 m)   Wt 155 lb 6.4 oz (70.5 kg)   SpO2 96%   BMI 24.34 kg/m   BMI: Body mass index is 24.34 kg/m. GEN: Well nourished, well developed AAF in no acute distress HEENT: normocephalic, atraumatic Neck: no JVD, carotid bruits, or masses Cardiac: RRR; 2/6 diastolic murmur RUSB, no rubs or gallops, no edema  Respiratory:  clear to auscultation bilaterally, normal work of breathing GI: soft, nontender, nondistended, + BS MS: no deformity or atrophy Skin: warm and dry, no rash Neuro:  Alert and Oriented x 3, Strength and sensation are intact, follows commands Psych: euthymic mood, full affect  Wt Readings from Last 3 Encounters:  10/09/17 155 lb 6.4 oz (70.5 kg)  04/04/16 151 lb (68.5 kg)  12/15/14 143 lb 12.8 oz (65.2 kg)      Studies/Labs Reviewed:   EKG:  EKG was ordered today and personally reviewed by me and demonstrates NSR 90bpm, with one PVC, possible LAE, LVH with secondary repol changes with nonspecific ST-T changes  Recent Labs: No results found for requested labs within last 8760 hours.   Lipid Panel    Component Value Date/Time   CHOL 211 (H) 04/21/2016 0800   TRIG 83 04/21/2016 0800   HDL 65 04/21/2016 0800   CHOLHDL 3.2 04/21/2016 0800   CHOLHDL 3 03/06/2014 0852   VLDL 18.6 03/06/2014 0852   LDLCALC 129 (H) 04/21/2016 0800   LDLDIRECT 128.9 03/05/2013 0932    Additional studies/ records that were reviewed today include: Summarized above   ASSESSMENT & PLAN:   1. Moderate AI/mild AS - per guidelines, should be followed with echo every 1-2 years. She is 1.5 years out from  last study. Will arrange. 2. Valvular cardiomyopathy - f/u by echocardiogram. Titrate BB as below. Appears euvolemic. 3. PVCs - previously noted on EKG, one noted on today's tracing. Asymptomatic. Check lytes, TSH. 4. Hyperlipidemia - she is fasting today. Check CMET/lipids. 5. HTN - she reports home BP 135-140 systolic. Titrate carvedilol to 6.25mg  BID. The patient was instructed to monitor their blood pressure at home and to call if tending to run higher than 130 systolic or 80 diastolic at home. She has plenty of room to move  otherwise on home regimen. Update labs. Discussed decreasing sodium in diet, staying active.  Disposition: F/u with 1 year with Dr. Delton See   Medication Adjustments/Labs and Tests Ordered: Current medicines are reviewed at length with the patient today.  Concerns regarding medicines are outlined above. Medication changes, Labs and Tests ordered today are summarized above and listed in the Patient Instructions accessible in Encounters.   Signed, Laurann [place], PA-C  10/09/2017 9:19 AM    [facility] [address] [place], [place], [place]  [address] Phone: [phone]; Fax: [phone]

## 2017-10-09 ENCOUNTER — Ambulatory Visit (INDEPENDENT_AMBULATORY_CARE_PROVIDER_SITE_OTHER): Payer: Commercial Managed Care - PPO | Admitting: Physician Assistant

## 2017-10-09 ENCOUNTER — Encounter: Payer: Self-pay | Admitting: Physician Assistant

## 2017-10-09 VITALS — BP 144/80 | HR 90 | Ht 67.0 in | Wt 155.4 lb

## 2017-10-09 DIAGNOSIS — E785 Hyperlipidemia, unspecified: Secondary | ICD-10-CM

## 2017-10-09 DIAGNOSIS — I351 Nonrheumatic aortic (valve) insufficiency: Secondary | ICD-10-CM

## 2017-10-09 DIAGNOSIS — I10 Essential (primary) hypertension: Secondary | ICD-10-CM

## 2017-10-09 DIAGNOSIS — I493 Ventricular premature depolarization: Secondary | ICD-10-CM

## 2017-10-09 DIAGNOSIS — I350 Nonrheumatic aortic (valve) stenosis: Secondary | ICD-10-CM | POA: Diagnosis not present

## 2017-10-09 DIAGNOSIS — I428 Other cardiomyopathies: Secondary | ICD-10-CM

## 2017-10-09 LAB — LIPID PANEL
CHOLESTEROL TOTAL: 191 mg/dL (ref 100–199)
Chol/HDL Ratio: 3 ratio (ref 0.0–4.4)
HDL: 64 mg/dL (ref >39)
LDL Calculated: 100 mg/dL — ABNORMAL HIGH (ref 0–99)
Triglycerides: 135 mg/dL (ref 0–149)
VLDL Cholesterol Cal: 27 mg/dL (ref 5–40)

## 2017-10-09 LAB — CBC
Hematocrit: 40.1 % (ref 34.0–46.6)
Hemoglobin: 13.2 g/dL (ref 11.1–15.9)
MCH: 27.6 pg (ref 26.6–33.0)
MCHC: 32.9 g/dL (ref 31.5–35.7)
MCV: 84 fL (ref 79–97)
Platelets: 270 10*3/uL (ref 150–450)
RBC: 4.79 x10E6/uL (ref 3.77–5.28)
RDW: 15.1 % (ref 12.3–15.4)
WBC: 4.1 10*3/uL (ref 3.4–10.8)

## 2017-10-09 LAB — COMPREHENSIVE METABOLIC PANEL
ALK PHOS: 100 IU/L (ref 39–117)
ALT: 23 IU/L (ref 0–32)
AST: 29 IU/L (ref 0–40)
Albumin/Globulin Ratio: 1.2 (ref 1.2–2.2)
Albumin: 4.2 g/dL (ref 3.5–5.5)
BILIRUBIN TOTAL: 0.5 mg/dL (ref 0.0–1.2)
BUN/Creatinine Ratio: 14 (ref 9–23)
BUN: 14 mg/dL (ref 6–24)
CHLORIDE: 103 mmol/L (ref 96–106)
CO2: 24 mmol/L (ref 20–29)
Calcium: 9.8 mg/dL (ref 8.7–10.2)
Creatinine, Ser: 1 mg/dL (ref 0.57–1.00)
GFR calc Af Amer: 74 mL/min/{1.73_m2} (ref >59)
GFR calc non Af Amer: 64 mL/min/{1.73_m2} (ref >59)
GLUCOSE: 95 mg/dL (ref 65–99)
Globulin, Total: 3.6 g/dL (ref 1.5–4.5)
Potassium: 4.5 mmol/L (ref 3.5–5.2)
Sodium: 143 mmol/L (ref 134–144)
TOTAL PROTEIN: 7.8 g/dL (ref 6.0–8.5)

## 2017-10-09 LAB — TSH: TSH: 1.87 u[IU]/mL (ref 0.450–4.500)

## 2017-10-09 LAB — MAGNESIUM: MAGNESIUM: 2.1 mg/dL (ref 1.6–2.3)

## 2017-10-09 MED ORDER — CARVEDILOL 6.25 MG PO TABS: 6.2500 mg | ORAL_TABLET | Freq: Two times a day (BID) | ORAL | 3 refills | Status: DC

## 2017-10-09 NOTE — Patient Instructions (Addendum)
Medication Instructions:  Your physician has recommended you make the following change in your medication:  1.  INCREASE the Carvedilol to 6.25 taking 1 tablet twice a day   Labwork: TODAY:  CMET, MAG, CBC, TSH, & LIPID  Testing/Procedures: Your physician has requested that you have an echocardiogram. Echocardiography is a painless test that uses sound waves to create images of your heart. It provides your doctor with information about the size and shape of your heart and how well your heart's chambers and valves are working. This procedure takes approximately one hour. There are no restrictions for this procedure.   Follow-Up: Your physician wants you to follow-up in: 1 YEAR WITH DR. Johnell Comings will receive a reminder letter in the mail two months in advance. If you don't receive a letter, please call our office to schedule the follow-up appointment.   Any Other Special Instructions Will Be Listed Below (If Applicable).  Please monitor your blood pressure occasionally at home. Call the office if you tend to get readings of greater than 130 on the top number or 80 on the bottom number.    Echocardiogram An echocardiogram, or echocardiography, uses sound waves (ultrasound) to produce an image of your heart. The echocardiogram is simple, painless, obtained within a short period of time, and offers valuable information to your health care provider. The images from an echocardiogram can provide information such as:  Evidence of coronary artery disease (CAD).  Heart size.  Heart muscle function.  Heart valve function.  Aneurysm detection.  Evidence of a past heart attack.  Fluid buildup around the heart.  Heart muscle thickening.  Assess heart valve function.  Tell a health care provider about:  Any allergies you have.  All medicines you are taking, including vitamins, herbs, eye drops, creams, and over-the-counter medicines.  Any problems you or family members have had  with anesthetic medicines.  Any blood disorders you have.  Any surgeries you have had.  Any medical conditions you have.  Whether you are pregnant or may be pregnant. What happens before the procedure? No special preparation is needed. Eat and drink normally. What happens during the procedure?  In order to produce an image of your heart, gel will be applied to your chest and a wand-like tool (transducer) will be moved over your chest. The gel will help transmit the sound waves from the transducer. The sound waves will harmlessly bounce off your heart to allow the heart images to be captured in real-time motion. These images will then be recorded.  You may need an IV to receive a medicine that improves the quality of the pictures. What happens after the procedure? You may return to your normal schedule including diet, activities, and medicines, unless your health care provider tells you otherwise. This information is not intended to replace advice given to you by your health care provider. Make sure you discuss any questions you have with your health care provider. Document Released: 01/15/2000 Document Revised: 09/05/2015 Document Reviewed: 09/24/2012 Elsevier Interactive Patient Education  2017 [employer].      If you need a refill on your cardiac medications before your next appointment, please call your pharmacy.

## 2017-10-11 ENCOUNTER — Telehealth: Payer: Self-pay | Admitting: Physician Assistant

## 2017-10-11 NOTE — Telephone Encounter (Signed)
New Message  Pt is returning call to nurse about labs. Please call

## 2017-10-11 NOTE — Telephone Encounter (Signed)
Patient aware of lab results as written below.    Notes recorded by Laurann Montana, PA-C on 10/09/2017 at 4:14 PM EDT Please let patient know labs were normal. LDL has improved. Continue current regimen. Remember to call us if BP does not reach goal! If it remains over 130 systolic I would suggest she further increase lisinopril to 40mg  daily and call us to let us know. Dayna Dunn PA-C

## 2017-10-16 ENCOUNTER — Ambulatory Visit (HOSPITAL_COMMUNITY): Payer: Commercial Managed Care - PPO

## 2017-10-19 ENCOUNTER — Other Ambulatory Visit: Payer: Self-pay

## 2017-10-19 ENCOUNTER — Telehealth: Payer: Self-pay | Admitting: Cardiology

## 2017-10-19 MED ORDER — LISINOPRIL 20 MG PO TABS: 20.0000 mg | ORAL_TABLET | Freq: Every day | ORAL | 0 refills | Status: DC

## 2017-10-19 MED ORDER — ROSUVASTATIN CALCIUM 5 MG PO TABS: 5.0000 mg | ORAL_TABLET | ORAL | 3 refills | Status: DC

## 2017-10-19 MED ORDER — POTASSIUM CHLORIDE CRYS ER 20 MEQ PO TBCR: 20.0000 meq | EXTENDED_RELEASE_TABLET | Freq: Every day | ORAL | 0 refills | Status: DC

## 2017-10-19 MED ORDER — FUROSEMIDE 40 MG PO TABS: ORAL_TABLET | ORAL | 0 refills | Status: DC

## 2017-10-19 NOTE — Telephone Encounter (Signed)
New Message:      *STAT* If patient is at the pharmacy, call can be transferred to refill team.   1. Which medications need to be refilled? (please list name of each medication and dose if known)   carvedilol (COREG) 6.25 MG tablet Take 1 tablet (6.25 mg total) by mouth 2 (two) times daily.    furosemide (LASIX) 40 MG tablet take 1 tablet by mouth once daily if needed for fluid retention   lisinopril (PRINIVIL,ZESTRIL) 20 MG tablet Take 1 tablet (20 mg total) by mouth daily. Please keep 09/13/17 appointment for further refills       potassium chloride SA (K-DUR,KLOR-CON) 20 MEQ tablet Take 1 tablet (20 mEq total) by mouth daily.   rosuvastatin (CRESTOR) 5 MG tablet(Expired) Take 1 tablet (5 mg total) by mouth 3 (three) times a week. Take on Mon, Wed, and Fri.     2. Which pharmacy/location (including street and city if local pharmacy) is medication to be sent to? [facility] [phone] - [place], [place] - [address]  3. Do they need a 30 day or 90 day supply? 30 Days

## 2017-10-23 ENCOUNTER — Other Ambulatory Visit (HOSPITAL_COMMUNITY): Payer: Commercial Managed Care - PPO

## 2017-10-23 ENCOUNTER — Telehealth: Payer: Self-pay | Admitting: Physician Assistant

## 2017-10-23 ENCOUNTER — Other Ambulatory Visit: Payer: Self-pay

## 2017-10-23 ENCOUNTER — Ambulatory Visit (HOSPITAL_COMMUNITY): Payer: Commercial Managed Care - PPO | Attending: Cardiovascular Disease

## 2017-10-23 DIAGNOSIS — E785 Hyperlipidemia, unspecified: Secondary | ICD-10-CM | POA: Diagnosis not present

## 2017-10-23 DIAGNOSIS — I351 Nonrheumatic aortic (valve) insufficiency: Secondary | ICD-10-CM | POA: Diagnosis present

## 2017-10-23 DIAGNOSIS — I083 Combined rheumatic disorders of mitral, aortic and tricuspid valves: Secondary | ICD-10-CM | POA: Diagnosis not present

## 2017-10-23 DIAGNOSIS — I493 Ventricular premature depolarization: Secondary | ICD-10-CM | POA: Insufficient documentation

## 2017-10-23 NOTE — Telephone Encounter (Signed)
Entered in error

## 2018-02-16 ENCOUNTER — Other Ambulatory Visit: Payer: Self-pay | Admitting: Cardiology

## 2018-02-16 MED ORDER — AMLODIPINE BESYLATE 5 MG PO TABS: 5.0000 mg | ORAL_TABLET | Freq: Every day | ORAL | 2 refills | Status: DC

## 2018-02-16 NOTE — Telephone Encounter (Signed)
Pt's medication was sent to pt's pharmacy as requested. Confirmation received.  °

## 2018-02-16 NOTE — Telephone Encounter (Signed)
New Message    *STAT* If patient is at the pharmacy, call can be transferred to refill team.   1. Which medications need to be refilled? (please list name of each medication and dose if known) Amlodipine  2. Which pharmacy/location (including street and city if local pharmacy) is medication to be sent to? [facility] on [address]   3. Do they need a 30 day or 90 day supply? 90 days

## 2018-03-15 ENCOUNTER — Other Ambulatory Visit: Payer: Self-pay | Admitting: Cardiology

## 2018-03-15 MED ORDER — LISINOPRIL 20 MG PO TABS: 20.0000 mg | ORAL_TABLET | Freq: Every day | ORAL | 3 refills | Status: DC

## 2018-03-15 NOTE — Telephone Encounter (Signed)
F/U Message         Refill not at pharmacy

## 2018-03-15 NOTE — Telephone Encounter (Signed)
Patient calling and is currently at her pharmacy to pick up her Rx for lisinopril 20 mg QD. Patient states that her refill was not sent in yet. Patient seen by Ronie Spies, PA on 10/09/17 with no changes to her lisinopril dose. Refill sent in to preferred pharmacy. Patient grateful for call.

## 2018-03-15 NOTE — Telephone Encounter (Signed)
°*  STAT* If patient is at the pharmacy, call can be transferred to refill team.   1. Which medications need to be refilled? (please list name of each medication and dose if known) lisinopril (PRINIVIL,ZESTRIL) 20 MG tablet  2. Which pharmacy/location (including street and city if local pharmacy) is medication to be sent to? [facility] [phone] - [place], [place] - [address]  3. Do they need a 30 day or 90 day supply? 90 days

## 2018-06-01 ENCOUNTER — Other Ambulatory Visit: Payer: Self-pay | Admitting: Physician Assistant

## 2018-06-01 ENCOUNTER — Telehealth: Payer: Self-pay | Admitting: Cardiology

## 2018-06-01 NOTE — Telephone Encounter (Signed)
Called pt to inform her that she has refills left at her pharmacy and that she needed to call her pharmacy to request a refill with them and if she has any other problems, questions or concerns to call the office. Pt verbalized understanding.

## 2018-06-01 NOTE — Telephone Encounter (Signed)
 *  STAT* If patient is at the pharmacy, call can be transferred to refill team.   1. Which medications need to be refilled? (please list name of each medication and dose if known) carvedilol (COREG) 6.25 MG tablet  2. Which pharmacy/location (including street and city if local pharmacy) is medication to be sent to? [facility]  3. Do they need a 30 day or 90 day supply? 90 day

## 2018-10-22 ENCOUNTER — Other Ambulatory Visit: Payer: Self-pay

## 2018-10-22 ENCOUNTER — Encounter (INDEPENDENT_AMBULATORY_CARE_PROVIDER_SITE_OTHER): Payer: Self-pay

## 2018-10-22 ENCOUNTER — Ambulatory Visit (INDEPENDENT_AMBULATORY_CARE_PROVIDER_SITE_OTHER): Payer: BLUE CROSS/BLUE SHIELD | Admitting: Cardiology

## 2018-10-22 ENCOUNTER — Encounter: Payer: Self-pay | Admitting: Cardiology

## 2018-10-22 VITALS — BP 132/62 | HR 82 | Ht 67.0 in | Wt 151.0 lb

## 2018-10-22 DIAGNOSIS — I350 Nonrheumatic aortic (valve) stenosis: Secondary | ICD-10-CM

## 2018-10-22 DIAGNOSIS — E785 Hyperlipidemia, unspecified: Secondary | ICD-10-CM | POA: Diagnosis not present

## 2018-10-22 DIAGNOSIS — E7800 Pure hypercholesterolemia, unspecified: Secondary | ICD-10-CM | POA: Diagnosis not present

## 2018-10-22 DIAGNOSIS — I351 Nonrheumatic aortic (valve) insufficiency: Secondary | ICD-10-CM

## 2018-10-22 LAB — CBC WITH DIFFERENTIAL/PLATELET
Basophils Absolute: 0 10*3/uL (ref 0.0–0.2)
Basos: 1 %
EOS (ABSOLUTE): 0.2 10*3/uL (ref 0.0–0.4)
Eos: 5 %
Hematocrit: 41.3 % (ref 34.0–46.6)
Hemoglobin: 13.6 g/dL (ref 11.1–15.9)
Immature Grans (Abs): 0 10*3/uL (ref 0.0–0.1)
Immature Granulocytes: 0 %
Lymphocytes Absolute: 1.7 10*3/uL (ref 0.7–3.1)
Lymphs: 37 %
MCH: 27.3 pg (ref 26.6–33.0)
MCHC: 32.9 g/dL (ref 31.5–35.7)
MCV: 83 fL (ref 79–97)
Monocytes Absolute: 0.4 10*3/uL (ref 0.1–0.9)
Monocytes: 9 %
Neutrophils Absolute: 2.2 10*3/uL (ref 1.4–7.0)
Neutrophils: 48 %
Platelets: 266 10*3/uL (ref 150–450)
RBC: 4.98 x10E6/uL (ref 3.77–5.28)
RDW: 13.6 % (ref 11.7–15.4)
WBC: 4.6 10*3/uL (ref 3.4–10.8)

## 2018-10-22 LAB — COMPREHENSIVE METABOLIC PANEL
ALT: 13 IU/L (ref 0–32)
AST: 22 IU/L (ref 0–40)
Albumin/Globulin Ratio: 1.2 (ref 1.2–2.2)
Albumin: 3.9 g/dL (ref 3.8–4.9)
Alkaline Phosphatase: 110 IU/L (ref 39–117)
BUN/Creatinine Ratio: 18 (ref 9–23)
BUN: 18 mg/dL (ref 6–24)
Bilirubin Total: 0.3 mg/dL (ref 0.0–1.2)
CO2: 23 mmol/L (ref 20–29)
Calcium: 9.7 mg/dL (ref 8.7–10.2)
Chloride: 106 mmol/L (ref 96–106)
Creatinine, Ser: 0.99 mg/dL (ref 0.57–1.00)
GFR calc Af Amer: 75 mL/min/{1.73_m2} (ref >59)
GFR calc non Af Amer: 65 mL/min/{1.73_m2} (ref >59)
Globulin, Total: 3.3 g/dL (ref 1.5–4.5)
Glucose: 95 mg/dL (ref 65–99)
Potassium: 4.8 mmol/L (ref 3.5–5.2)
Sodium: 140 mmol/L (ref 134–144)
Total Protein: 7.2 g/dL (ref 6.0–8.5)

## 2018-10-22 LAB — LIPID PANEL
Chol/HDL Ratio: 3.7 ratio (ref 0.0–4.4)
Cholesterol, Total: 223 mg/dL — ABNORMAL HIGH (ref 100–199)
HDL: 61 mg/dL (ref >39)
LDL Chol Calc (NIH): 138 mg/dL — ABNORMAL HIGH (ref 0–99)
Triglycerides: 137 mg/dL (ref 0–149)
VLDL Cholesterol Cal: 24 mg/dL (ref 5–40)

## 2018-10-22 LAB — TSH: TSH: 1.47 u[IU]/mL (ref 0.450–4.500)

## 2018-10-22 MED ORDER — ROSUVASTATIN CALCIUM 5 MG PO TABS: 5.0000 mg | ORAL_TABLET | ORAL | 3 refills | Status: DC

## 2018-10-22 MED ORDER — POTASSIUM CHLORIDE CRYS ER 20 MEQ PO TBCR: EXTENDED_RELEASE_TABLET | ORAL | 3 refills | Status: DC

## 2018-10-22 MED ORDER — CARVEDILOL 6.25 MG PO TABS: 6.2500 mg | ORAL_TABLET | Freq: Two times a day (BID) | ORAL | 3 refills | Status: DC

## 2018-10-22 MED ORDER — LISINOPRIL 20 MG PO TABS: ORAL_TABLET | ORAL | 3 refills | Status: DC

## 2018-10-22 MED ORDER — AMLODIPINE BESYLATE 5 MG PO TABS: 5.0000 mg | ORAL_TABLET | Freq: Every day | ORAL | 2 refills | Status: DC

## 2018-10-22 NOTE — Progress Notes (Signed)
Cardiology Office Note    Date:  10/22/2018  ID:  Wendy Stanley, DOB March 23, 1965, MRN [id number] PCP:  Patient, No Pcp Per  Cardiologist:  Ena Dawley, MD   Chief Complaint: f/u valve disease  History of Present Illness:  Wendy Stanley is a 53 y.o. 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 She was admitted to [facility] in 2002 with flash pulmonary edema. Since then she has been on ACEI, Lasix and beta blocker and doing well. Prior echo 8/2012showed normal ejection fraction of 55-60% with grade 2 diastolic dysfunction and showed mild aortic stenosis with moderate aortic insufficiency. Echoes in 2015-2016 showed EF in the 45-50% range. This was felt possibly related to poorly controlled blood pressure, so meds were titrated. F/u echo 05/2016 showed mild LVH, EF 60-65%, moderate AI, mild AS. Last labs 03/2016 showed LDL 129, normal TSH, WBC 3.3, Hgb 12.9, Cr 0.84, K 4.9; TSH 6.5 in 2015 with normal fT4.  10/22/18  - 1 year follow up, she is doing great, exercising 3-5x/week, no symptoms, she denies CP, DOE, no LE edema, tolerates rosuvastatin, denies palpitations.   Past Medical History:  Diagnosis Date  . Aortic insufficiency   . Aortic valve regurgitation 10/13/2010  . Flash pulmonary edema ([place]) 2012  . Frequent PVCs   . Hypercholesterolemia 12/27/2011  . Mild aortic stenosis   . Valvular cardiomyopathy [facility])     Past Surgical History:  Procedure Laterality Date  . US ECHOCARDIOGRAPHY  08/28/2008   EF 55-60%  . US ECHOCARDIOGRAPHY  05/06/2004   EF 55-60%    Current Medications: Current Meds  Medication Sig  . amLODipine (NORVASC) 5 MG tablet Take 1 tablet (5 mg total) by mouth daily.  Marland Kitchen aspirin EC 81 MG tablet Take 81 mg by mouth daily.    . B Complex-C (B-COMPLEX WITH VITAMIN C) tablet Take 1 tablet by mouth daily.  . carvedilol (COREG) 6.25 MG tablet Take 1 tablet  (6.25 mg total) by mouth 2 (two) times daily.  . furosemide (LASIX) 40 MG tablet TAKE 1 TABLET BY MOUTH EVERY DAY AS NEEDED FOR FLUID RETENTION  . lisinopril (ZESTRIL) 20 MG tablet TAKE 1 TABLET BY MOUTH EVERY DAY**PLEASE KEEP APPOINTMENT FOR FURTHER REFILLS**  . Multiple Vitamin (MULTIVITAMIN) tablet Take 1 tablet by mouth every other day.   . potassium chloride SA (K-DUR) 20 MEQ tablet TAKE 1 TABLET(20 MEQ) BY MOUTH DAILY  . rosuvastatin (CRESTOR) 5 MG tablet Take 1 tablet (5 mg total) by mouth 3 (three) times a week. Take on Mon, Wed, and Fri.  Marland Kitchen VITAMIN D, CHOLECALCIFEROL, PO Take 1 capsule by mouth daily.      Allergies:   Patient has no known allergies.   Social History   Socioeconomic History  . Marital status: Single    Spouse name: Not on file  . Number of children: Not on file  . Years of education: Not on file  . Highest education level: Not on file  Occupational History  . Not on file  Social Needs  . Financial resource strain: Not on file  . Food insecurity    Worry: Not on file    Inability: Not on file  . Transportation needs    Medical: Not on file    Non-medical: Not on file  Tobacco Use  . Smoking status: Never Smoker  . Smokeless tobacco: Never Used  Substance and Sexual Activity  . Alcohol use:  No  . Drug use: No  . Sexual activity: Not on file  Lifestyle  . Physical activity    Days per week: Not on file    Minutes per session: Not on file  . Stress: Not on file  Relationships  . Social Musician on phone: Not on file    Gets together: Not on file    Attends religious service: Not on file    Active member of club or organization: Not on file    Attends meetings of clubs or organizations: Not on file    Relationship status: Not on file  Other Topics Concern  . Not on file  Social History Narrative  . Not on file     Family History:  The patient's family history includes Hypertension in her father; Kidney failure in her father;  Mitral valve prolapse in her sister and sister.  ROS:   Please see the history of present illness.  All other systems are reviewed and otherwise negative.    PHYSICAL EXAM:   VS:  BP 132/62   Pulse 82   Ht 5\' 7"  (1.702 m)   Wt 151 lb (68.5 kg)   SpO2 96%   BMI 23.65 kg/m   BMI: Body mass index is 23.65 kg/m. GEN: Well nourished, well developed AAF in no acute distress HEENT: normocephalic, atraumatic Neck: no JVD, carotid bruits, or masses Cardiac: RRR; 2/6 diastolic murmur RUSB, no rubs or gallops, no edema  Respiratory:  clear to auscultation bilaterally, normal work of breathing GI: soft, nontender, nondistended, + BS MS: no deformity or atrophy Skin: warm and dry, no rash Neuro:  Alert and Oriented x 3, Strength and sensation are intact, follows commands Psych: euthymic mood, full affect  Wt Readings from Last 3 Encounters:  10/22/18 151 lb (68.5 kg)  10/09/17 155 lb 6.4 oz (70.5 kg)  04/04/16 151 lb (68.5 kg)      Studies/Labs Reviewed:   EKG:  EKG was ordered today and personally reviewed by me and demonstrates NSR 90bpm, with one PVC, possible LAE, LVH with secondary repol changes with nonspecific ST-T changes  Recent Labs: No results found for requested labs within last 8760 hours.   Lipid Panel    Component Value Date/Time   CHOL 191 10/09/2017 0941   TRIG 135 10/09/2017 0941   HDL 64 10/09/2017 0941   CHOLHDL 3.0 10/09/2017 0941   CHOLHDL 3 03/06/2014 0852   VLDL 18.6 03/06/2014 0852   LDLCALC 100 (H) 10/09/2017 0941   LDLDIRECT 128.9 03/05/2013 0932    Additional studies/ records that were reviewed today include: Summarized above   ASSESSMENT & PLAN:   1. Mild AI/mild AS - per guidelines, should be followed with echo every 1-2 years. Stable on 10/23/2017, we will repeat prior to the next year's visit. 2. Valvular cardiomyopathy - f/u by echocardiogram. Titrate BB as below. Appears euvolemic. 3. Hyperlipidemia - she is fasting today. Check  CMET/lipids. 4. HTN -well controlled  Disposition: F/u with 1 year with Dr. 10/25/2017, albs today, echocardiogram next year.   Medication Adjustments/Labs and Tests Ordered: Current medicines are reviewed at length with the patient today.  Concerns regarding medicines are outlined above. Medication changes, Labs and Tests ordered today are summarized above and listed in the Patient Instructions accessible in Encounters.   Signed, Delton See, MD  10/22/2018 8:26 AM    [facility] [address] [place], [place], [place]  [place] Phone: [phone]; Fax: [phone]  

## 2018-10-22 NOTE — Patient Instructions (Signed)
Medication Instructions:   Your physician recommends that you continue on your current medications as directed. Please refer to the Current Medication list given to you today.   If you need a refill on your cardiac medications before your next appointment, please call your pharmacy.     Lab work:  TODAY-CMET, CBC W DIFF, TSH, AND LIPIDS  If you have labs (blood work) drawn today and your tests are completely normal, you will receive your results only by: Marland Kitchen MyChart Message (if you have MyChart) OR . A paper copy in the mail If you have any lab test that is abnormal or we need to change your treatment, we will call you to review the results.    Testing/Procedures:  Your physician has requested that you have an echocardiogram. Echocardiography is a painless test that uses sound waves to create images of your heart. It provides your doctor with information about the size and shape of your heart and how well your heart's chambers and valves are working. This procedure takes approximately one hour. There are no restrictions for this procedure.  THIS SHOULD BE SCHEDULED FOR ONE YEAR OUT PER DR NELSON     Follow-Up: At [facility], you and your health needs are our priority.  As part of our continuing mission to provide you with exceptional heart care, we have created designated Provider Care Teams.  These Care Teams include your primary Cardiologist (physician) and Advanced Practice Providers (APPs -  Physician Assistants and Nurse Practitioners) who all work together to provide you with the care you need, when you need it. You will need a follow up appointment in 12 months.  Please call our office 2 months in advance to schedule this appointment.  You may see Ena Dawley, MD or one of the following Advanced Practice Providers on your designated Care Team:   [place], PA-C Melina Copa, PA-C . Ermalinda Barrios, PA-C

## 2018-10-23 ENCOUNTER — Telehealth: Payer: Self-pay | Admitting: *Deleted

## 2018-10-23 MED ORDER — ROSUVASTATIN CALCIUM 5 MG PO TABS: 5.0000 mg | ORAL_TABLET | Freq: Every day | ORAL | 3 refills | Status: DC

## 2018-10-23 NOTE — Telephone Encounter (Signed)
-----   Message from Nuala Alpha, LPN sent at 8/65/7846 12:51 PM EDT -----  ----- Message ----- From: Dorothy Spark, MD Sent: 10/22/2018   7:21 PM EDT To: Nuala Alpha, LPN  All labs normal except for elevated LDL, I would increase rosuvastatin 5 mg po from 3x/week to daily if she can tolerate it.

## 2018-10-23 NOTE — Telephone Encounter (Signed)
Pt has been notified of lab results and recommendations. Pt is agreeable to increase the Crestor 5 mg 3 x weekly to take everyday. New Rx has been sent in for Crestor 5 mg daily to [facility] on [address]. Pt thanked me for the call. Patient notified of result.  Please refer to phone note from today for complete details.   Julaine Hua, CMA 10/23/2018 1:22 PM

## 2019-10-09 ENCOUNTER — Encounter (HOSPITAL_COMMUNITY): Payer: Self-pay | Admitting: Cardiology

## 2019-10-09 ENCOUNTER — Other Ambulatory Visit (HOSPITAL_COMMUNITY): Payer: BLUE CROSS/BLUE SHIELD

## 2019-10-21 ENCOUNTER — Telehealth (HOSPITAL_COMMUNITY): Payer: Self-pay | Admitting: Cardiology

## 2019-10-21 NOTE — Telephone Encounter (Signed)
Just an FYI. We have made several attempts to contact this patient including sending a letter to schedule or reschedule their echocardiogram. We will be removing the patient from the echo WQ.   10/09/19 MAILED LETTER [facility]  10/09/2019 Patient no showed     Thank you

## 2019-10-21 NOTE — Telephone Encounter (Signed)
Will send this message to Dr. Nelson as a general FYI.  

## 2019-12-10 NOTE — Progress Notes (Addendum)
Cardiology Office Note  Date:  12/11/2019   ID:  Wendy Stanley, DOB [birth year], MRN [id number]  PCP:  Patient, No Pcp Per  Cardiologist:  Dr. Delton See  _____________  1 year follow-up  _____________   History of Present Illness: Wendy Stanley is a 54 y.o. female with pmh of AI, aortic stenosis, valvular CM, frequent PVCs on EKG, HLD. She was admitted to [facility] in 2002 with flash pulmonary edema. She was started on ACEi, lasix, BB. Prior echo 09/2010 showed normal EF 55-60% with G2DD and G2DD with mild AS and mod AI. Echoes in 2015-2016 showed EF in the 45-50% range. This was felt possibly related to poorly controlled BP, so meds were titrated. F/u echo 05/2016 showed mild LVH, EF 60-65%, mod AI, mild AS.  Echo from 10/2017 showed LVEF 55% with G1DD and mild AI and mild MR. The patient was last seen 10/2018 and patient was doing great.   Today, she reports she is doing well. Wendy Stanley been taking her medications withou issues. BP today a little high however she forgot to take her medications this morning. At home BP normally runs 130/60-70. 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 She exercises 5 days a week. She generally cooks at home. She has lasix to take as needed. The last time she needed it was in August after traveling and noticed mild lower leg edema. Weights have been stable. She is vaccinated, has not had Covid. Need slabs today. denies symptoms of palpitations, chest pain, shortness of breath, orthopnea, PND, lower extremity edema, claudication, dizziness, presyncope, syncope, bleeding, or neurologic sequela. The patient is tolerating medications without difficulties and is otherwise without complaint today.   _____________   Past Medical History:  Diagnosis Date  . Aortic insufficiency   . Aortic valve regurgitation 10/13/2010  . Flash pulmonary edema ([facility]) 2012  . Frequent PVCs   . Hypercholesterolemia 12/27/2011  . Mild aortic stenosis   . Valvular  cardiomyopathy [facility])    Past Surgical History:  Procedure Laterality Date  . US ECHOCARDIOGRAPHY  08/28/2008   EF 55-60%  . US ECHOCARDIOGRAPHY  05/06/2004   EF 55-60%   _____________  Current Outpatient Medications  Medication Sig Dispense Refill  . amLODipine (NORVASC) 5 MG tablet Take 1 tablet (5 mg total) by mouth daily. 90 tablet 2  . aspirin EC 81 MG tablet Take 81 mg by mouth daily.      . B Complex-C (B-COMPLEX WITH VITAMIN C) tablet Take 1 tablet by mouth daily.    . carvedilol (COREG) 6.25 MG tablet Take 1 tablet (6.25 mg total) by mouth 2 (two) times daily. 180 tablet 3  . furosemide (LASIX) 40 MG tablet TAKE 1 TABLET BY MOUTH EVERY DAY AS NEEDED FOR FLUID RETENTION 90 tablet 1  . lisinopril (ZESTRIL) 20 MG tablet TAKE 1 TABLET BY MOUTH EVERY DAY 90 tablet 3  . Multiple Vitamin (MULTIVITAMIN) tablet Take 1 tablet by mouth every other day.     . potassium chloride SA (K-DUR) 20 MEQ tablet TAKE 1 TABLET(20 MEQ) BY MOUTH DAILY 90 tablet 3  . rosuvastatin (CRESTOR) 5 MG tablet Take 1 tablet (5 mg total) by mouth daily. 90 tablet 3  . VITAMIN D, CHOLECALCIFEROL, PO Take 1 capsule by mouth daily.     No current facility-administered medications for this visit.   _____________   Allergies:   Patient has no known allergies.  _____________   Social History:  The patient  reports  that she has never smoked. She has never used smokeless tobacco. She reports that she does not drink alcohol and does not use drugs.  _____________   Family History:  The patient's family history includes Hypertension in her father; Kidney failure in her father; Mitral valve prolapse in her sister and sister.  _____________   ROS:  Please see the history of present illness.    All other systems are reviewed and negative.  _____________   PHYSICAL EXAM: VS:  There were no vitals taken for this visit. , BMI There is no height or weight on file to calculate BMI. GEN: Well nourished, well developed, in no  acute distress  HEENT: normal  Neck: no JVD, carotid bruits, or masses Cardiac: RRR; + murmurs, no rubs, or gallops. No clubbing, cyanosis, edema.  Radials/DP/PT 2+ and equal bilaterally.  Respiratory:  clear to auscultation bilaterally, normal work of breathing GI: soft, nontender, nondistended, + BS MS: no deformity or atrophy  Skin: warm and dry, no rash Neuro:  Strength and sensation are intact Psych: euthymic mood, full affect _____________  EKG:   The ekg ordered today shows NSR, 86, LVH with possible repol abnormalities, no significant change from prior   Recent Labs: No results found for requested labs within last 8760 hours.  No results found for requested labs within last 8760 hours.  CrCl cannot be calculated (Patient's most recent lab result is older than the maximum 21 days allowed.).  Wt Readings from Last 3 Encounters:  10/22/18 151 lb (68.5 kg)  10/09/17 155 lb 6.4 oz (70.5 kg)  04/04/16 151 lb (68.5 kg)   Relevant Studies:  Echo 10/2017 Study Conclusions   - Left ventricle: Doppler parameters are consistent with abnormal  left ventricular relaxation (grade 1 diastolic dysfunction).  - Aortic valve: The AV appears tri leaflet Leaflet motion is not  particularly restricted but there is a small gradient across the  valve No subvalvular web seen There was mild regurgitation. Valve  area (VTI): 1.36 cm^2. Valve area (Vmax): 1.2 cm^2. Valve area  (Vmean): 1.15 cm^2.  - Mitral valve: There was mild regurgitation.  - Atrial septum: No defect or patent foramen ovale was identified.   _____________   ASSESSMENT AND PLAN:  Mild AI/mild AS  Per echo in 2019. Repeat echo today. Per guidelines repeat echo every 1-2 years. Murmur on exam. No chest pain or sob.   Valvular CM Echo in 2019 showed LVEF 55%, G1DD, mild AI, mild MR. Repeat echo as above.She only takes lasix as needed, last time was in August after traveling. Weights stable. Continue BB and  lisinopril. CMET and CBC today  HLD LDL 138 in 10/2018. Patient has been exercising and eating relatively healthy. Repeat FLP and LFTs  HTN BP today 140/74 however patient did not take her medications today. It is generally 130/60-70. Continue lisinopril, coreg, and amlodipine. Labs today as above.    Disposition:   FU with MD in 1 year   Signed, Raelynne Ludwick David Stall, PA-C 12/11/2019 3:55 PM    _____________ [facility] [address] [place] [address]  [phone] (office) [phone] (fax)

## 2019-12-11 ENCOUNTER — Other Ambulatory Visit: Payer: Self-pay

## 2019-12-11 ENCOUNTER — Ambulatory Visit (INDEPENDENT_AMBULATORY_CARE_PROVIDER_SITE_OTHER): Payer: Self-pay | Admitting: Medical

## 2019-12-11 ENCOUNTER — Encounter: Payer: Self-pay | Admitting: Medical

## 2019-12-11 VITALS — BP 140/74 | HR 86 | Ht 67.0 in | Wt 150.8 lb

## 2019-12-11 DIAGNOSIS — I428 Other cardiomyopathies: Secondary | ICD-10-CM

## 2019-12-11 DIAGNOSIS — I351 Nonrheumatic aortic (valve) insufficiency: Secondary | ICD-10-CM

## 2019-12-11 DIAGNOSIS — I10 Essential (primary) hypertension: Secondary | ICD-10-CM

## 2019-12-11 DIAGNOSIS — E785 Hyperlipidemia, unspecified: Secondary | ICD-10-CM

## 2019-12-11 NOTE — Patient Instructions (Signed)
Medication Instructions:    Your physician recommends that you continue on your current medications as directed. Please refer to the Current Medication list given to you today.  *If you need a refill on your cardiac medications before your next appointment, please call your pharmacy*   Lab Work:   CMET AND CBC TODAY    RETURN ON SAME DAY AS ECHO FASTING LIPIDS AND LIVER   If you have labs (blood work) drawn today and your tests are completely normal, you will receive your results only by: Marland Kitchen MyChart Message (if you have MyChart) OR . A paper copy in the mail If you have any lab test that is abnormal or we need to change your treatment, we will call you to review the results.   Testing/Procedures:Your physician has requested that you have an echocardiogram. Echocardiography is a painless test that uses sound waves to create images of your heart. It provides your doctor with information about the size and shape of your heart and how well your heart's chambers and valves are working. This procedure takes approximately one hour. There are no restrictions for this procedure.   Follow-Up: At [facility], you and your health needs are our priority.  As part of our continuing mission to provide you with exceptional heart care, we have created designated Provider Care Teams.  These Care Teams include your primary Cardiologist (physician) and Advanced Practice Providers (APPs -  Physician Assistants and Nurse Practitioners) who all work together to provide you with the care you need, when you need it.  We recommend signing up for the patient portal called "MyChart".  Sign up information is provided on this After Visit Summary.  MyChart is used to connect with patients for Virtual Visits (Telemedicine).  Patients are able to view lab/test results, encounter notes, upcoming appointments, etc.  Non-urgent messages can be sent to your provider as well.   To learn more about what you can do with  MyChart, go to [email or web address].    Your next appointment:   1 year(s)  The format for your next appointment:   In Person  Provider:   You may see Tobias Alexander, MD or one of the following Advanced Practice Providers on your designated Care Team:    Ronie Spies, PA-C  Jacolyn Reedy, PA-C    Other Instructions

## 2019-12-12 LAB — COMPREHENSIVE METABOLIC PANEL
ALT: 19 IU/L (ref 0–32)
AST: 22 IU/L (ref 0–40)
Albumin/Globulin Ratio: 1.3 (ref 1.2–2.2)
Albumin: 4.1 g/dL (ref 3.8–4.9)
Alkaline Phosphatase: 105 IU/L (ref 44–121)
BUN/Creatinine Ratio: 13 (ref 9–23)
BUN: 13 mg/dL (ref 6–24)
Bilirubin Total: 0.4 mg/dL (ref 0.0–1.2)
CO2: 23 mmol/L (ref 20–29)
Calcium: 9 mg/dL (ref 8.7–10.2)
Chloride: 109 mmol/L — ABNORMAL HIGH (ref 96–106)
Creatinine, Ser: 1.01 mg/dL — ABNORMAL HIGH (ref 0.57–1.00)
GFR calc Af Amer: 72 mL/min/{1.73_m2} (ref >59)
GFR calc non Af Amer: 63 mL/min/{1.73_m2} (ref >59)
Globulin, Total: 3.1 g/dL (ref 1.5–4.5)
Glucose: 89 mg/dL (ref 65–99)
Potassium: 4.3 mmol/L (ref 3.5–5.2)
Sodium: 142 mmol/L (ref 134–144)
Total Protein: 7.2 g/dL (ref 6.0–8.5)

## 2019-12-12 LAB — CBC
Hematocrit: 39.4 % (ref 34.0–46.6)
Hemoglobin: 12.7 g/dL (ref 11.1–15.9)
MCH: 26.9 pg (ref 26.6–33.0)
MCHC: 32.2 g/dL (ref 31.5–35.7)
MCV: 84 fL (ref 79–97)
Platelets: 266 10*3/uL (ref 150–450)
RBC: 4.72 x10E6/uL (ref 3.77–5.28)
RDW: 13.6 % (ref 11.7–15.4)
WBC: 5.4 10*3/uL (ref 3.4–10.8)

## 2019-12-19 ENCOUNTER — Other Ambulatory Visit: Payer: Self-pay | Admitting: Cardiology

## 2019-12-19 ENCOUNTER — Other Ambulatory Visit: Payer: Self-pay

## 2019-12-19 DIAGNOSIS — I350 Nonrheumatic aortic (valve) stenosis: Secondary | ICD-10-CM

## 2019-12-19 DIAGNOSIS — I351 Nonrheumatic aortic (valve) insufficiency: Secondary | ICD-10-CM

## 2019-12-19 DIAGNOSIS — E785 Hyperlipidemia, unspecified: Secondary | ICD-10-CM

## 2019-12-19 DIAGNOSIS — E7800 Pure hypercholesterolemia, unspecified: Secondary | ICD-10-CM

## 2019-12-19 MED ORDER — POTASSIUM CHLORIDE CRYS ER 20 MEQ PO TBCR: EXTENDED_RELEASE_TABLET | ORAL | 3 refills | Status: DC

## 2019-12-19 MED ORDER — FUROSEMIDE 40 MG PO TABS
ORAL_TABLET | ORAL | 1 refills | Status: DC
Start: 2019-12-19 — End: 2021-02-24

## 2019-12-19 MED ORDER — ROSUVASTATIN CALCIUM 5 MG PO TABS
5.0000 mg | ORAL_TABLET | Freq: Every day | ORAL | 3 refills | Status: DC
Start: 2019-12-19 — End: 2021-02-24

## 2019-12-19 MED ORDER — LISINOPRIL 20 MG PO TABS: ORAL_TABLET | ORAL | 3 refills | Status: DC

## 2019-12-19 MED ORDER — AMLODIPINE BESYLATE 5 MG PO TABS: 5.0000 mg | ORAL_TABLET | Freq: Every day | ORAL | 2 refills | Status: DC

## 2019-12-19 MED ORDER — CARVEDILOL 6.25 MG PO TABS: 6.2500 mg | ORAL_TABLET | Freq: Two times a day (BID) | ORAL | 3 refills | Status: DC

## 2020-01-03 ENCOUNTER — Other Ambulatory Visit: Payer: BC Managed Care – PPO | Admitting: *Deleted

## 2020-01-03 ENCOUNTER — Telehealth (HOSPITAL_COMMUNITY): Payer: Self-pay | Admitting: Medical

## 2020-01-03 ENCOUNTER — Other Ambulatory Visit: Payer: Self-pay

## 2020-01-03 ENCOUNTER — Other Ambulatory Visit (HOSPITAL_COMMUNITY): Payer: Self-pay

## 2020-01-03 DIAGNOSIS — I351 Nonrheumatic aortic (valve) insufficiency: Secondary | ICD-10-CM

## 2020-01-03 LAB — HEPATIC FUNCTION PANEL
ALT: 20 IU/L (ref 0–32)
AST: 23 IU/L (ref 0–40)
Albumin: 3.9 g/dL (ref 3.8–4.9)
Alkaline Phosphatase: 95 IU/L (ref 44–121)
Bilirubin Total: 0.4 mg/dL (ref 0.0–1.2)
Bilirubin, Direct: <0.1 mg/dL (ref 0.00–0.40)
Total Protein: 7.1 g/dL (ref 6.0–8.5)

## 2020-01-03 LAB — LIPID PANEL
Chol/HDL Ratio: 3 ratio (ref 0.0–4.4)
Cholesterol, Total: 178 mg/dL (ref 100–199)
HDL: 59 mg/dL (ref >39)
LDL Chol Calc (NIH): 105 mg/dL — ABNORMAL HIGH (ref 0–99)
Triglycerides: 77 mg/dL (ref 0–149)
VLDL Cholesterol Cal: 14 mg/dL (ref 5–40)

## 2020-01-03 NOTE — Telephone Encounter (Signed)
Will send this message to ordering Provider, Cadence Lorna Few as a general FYI, to make her aware of this.

## 2020-01-03 NOTE — Telephone Encounter (Signed)
Patient arrived at the [facility] office at 9:15 for her 7:35 echocardiogram which she had already No Showed for on 10/09/19 also.  We checked schedule and was going to offer an 11:35 appointment due to a cancellation.  We went to talk with patient and Registration called and stated that patient left the office before we could speak with patient and stated that she would call back later to reschedule.  Order will be removed from the ECHO WQ and when patient calls back to reschedule we will reinstate the order. Thank you.

## 2020-01-07 ENCOUNTER — Encounter: Payer: Self-pay | Admitting: *Deleted

## 2021-02-23 ENCOUNTER — Encounter: Payer: Self-pay | Admitting: Physician Assistant

## 2021-02-23 DIAGNOSIS — I359 Nonrheumatic aortic valve disorder, unspecified: Secondary | ICD-10-CM | POA: Insufficient documentation

## 2021-02-23 DIAGNOSIS — I428 Other cardiomyopathies: Secondary | ICD-10-CM

## 2021-02-23 DIAGNOSIS — I493 Ventricular premature depolarization: Secondary | ICD-10-CM | POA: Insufficient documentation

## 2021-02-23 HISTORY — DX: Other cardiomyopathies: I42.8

## 2021-02-23 NOTE — Progress Notes (Signed)
Cardiology Office Note:    Date:  02/24/2021   ID:  Wendy Stanley, DOB Jun 12, 1965, MRN [id number]  PCP:  Patient, No Pcp Per (Inactive)  [facility] Providers Cardiologist:  Wendy Bergeron, MD Cardiology APP:  Wendy Stanley    Referring MD: No ref. provider found   Chief Complaint:  Follow-up for aortic valve disease, hypertension    Patient Profile: Specialty Problems       Cardiology Problems   Hypertension   Hypercholesterolemia   Aortic valve disease    Aortic insufficiency and Aortic Stenosis Pt admx to [facility] in 2002 w flash pulmonary edema  Stable on ACE and Furosemide  Echo 10/23/2017: GR 1 DD, small AV gradient (mean 11), mild AI, mild MR Echo 05/04/2016: Mild LVH, EF 60-65, no RWMA, GR 1 DD, mild aortic stenosis (mean gradient 14 mmHg), moderate AI, trivial MR Echo 01/01/2015: Mild LVH, EF 45-50, diffuse HK, GR 1 DD, mild aortic stenosis (mean gradient 9), moderate AI, mild MR Echo (09/28/10):  EF 55% to 60%. Wall motion was normal; Grade 1 diastolic dysfunction, mild aortic stenosis (Mean gradient: 5m Hg (S). Peak gradient: 278mHg). Moderate aortic regurgitation.      [place]ischemic cardiomyopathy ([facility] [place]   Thought to be due to uncontrolled HTN // Improved to normal  EF in 12/16: 45-50 EF in 4/18: 60-65      PVC's (premature ventricular contractions)     History of Present Illness:   Wendy Stanley a [age].o. female with the above problem list.  She was last seen in 11/21 by Wendy Furth, PA-C.  She returns for follow-up.  She is here alone.  Overall, she has been doing well without chest pain, shortness of breath, syncope, orthopnea, leg edema.  She is lost about 7 pounds since last year.  She ran out of amlodipine a couple of weeks ago.  She has not taken any medications yet today.  She did have a salty meal last night.    Past Medical History:  Diagnosis Date   Aortic insufficiency    Aortic valve regurgitation 10/13/2010   [place]  [place]ulmonary [place] ([place]   Frequent PVCs    Hypercholesterolemia 12/27/2011   Mild aortic stenosis    Nonischemic cardiomyopathy ([place]/24/2023   Thought to be due to uncontrolled HTN // Improved to normal  EF in 12/16: 45-50 EF in 4/18: 60-65   Valvular cardiomyopathy (HCC)    Current Medications: Current Meds  Medication Sig   aspirin EC 81 MG tablet Take 81 mg by mouth daily.     B Complex-C (B-COMPLEX WITH VITAMIN C) tablet Take 1 tablet by mouth daily.   lisinopril (ZESTRIL) 20 MG tablet TAKE 1 TABLET BY MOUTH EVERY DAY   Multiple Vitamin (MULTIVITAMIN) tablet Take 1 tablet by mouth every other day.    potassium chloride SA (KLOR-CON) 20 MEQ tablet TAKE 1 TABLET(20 MEQ) BY MOUTH DAILY   rosuvastatin (CRESTOR) 5 MG tablet Take 1 tablet (5 mg total) by mouth daily.   VITAMIN D, CHOLECALCIFEROL, PO Take 1 capsule by mouth daily.   [DISCONTINUED] amLODipine (NORVASC) 5 MG tablet Take 1 tablet (5 mg total) by mouth daily.   [DISCONTINUED] carvedilol (COREG) 6.25 MG tablet Take 1 tablet (6.25 mg total) by mouth 2 (two) times daily.   [DISCONTINUED] furosemide (LASIX) 40 MG tablet TAKE 1 TABLET BY MOUTH EVERY DAY AS NEEDED FOR FLUID RETENTION    Allergies:   Patient has no known allergies.  Social History   Tobacco Use   Smoking status: Never   Smokeless tobacco: Never  Substance Use Topics   Alcohol use: No   Drug use: No    Family Hx: The patient's family history includes Hypertension in her father; Kidney failure in her father; Mitral valve prolapse in her sister and sister.  ROS see HPI  EKGs/Labs/Other Test Reviewed:    EKG:  EKG is   ordered today.  The ekg ordered today demonstrates NSR, HR 78, normal axis, no ST-T wave changes, QTC 458, no change from prior tracing  Recent Labs: No results found for requested labs within last 8760 hours.   Recent Lipid Panel No results for input(s): CHOL, TRIG, HDL, VLDL, LDLCALC, LDLDIRECT in the last 8760 hours.   Risk  Assessment/Calculations:         Physical Exam:    VS:  BP (!) 180/84 (BP Location: Left Arm)    Pulse 78    Ht '5\' 7"'  (1.702 m)    Wt 143 lb 6.4 oz (65 kg)    SpO2 97%    BMI 22.46 kg/m     Wt Readings from Last 3 Encounters:  02/24/21 143 lb 6.4 oz (65 kg)  12/11/19 150 lb 12.8 oz (68.4 kg)  10/22/18 151 lb (68.5 kg)    Constitutional:      Appearance: Healthy appearance. Not in distress.  Neck:     Vascular: No JVR. JVD normal.  Pulmonary:     Effort: Pulmonary effort is normal.     Breath sounds: No wheezing. No rales.  Cardiovascular:     Normal rate. Regular rhythm. Normal S1. Normal S2.      Murmurs: There is a grade 2/6 crescendo-decrescendo systolic murmur at the URSB. There is a grade 2/4 decrescendo diastolic murmur at the LLSB.  Pulses:    Intact distal pulses.  Edema:    Peripheral edema absent.  Abdominal:     Palpations: Abdomen is soft.  Skin:    General: Skin is warm and dry.  Neurological:     Mental Status: Alert and oriented to person, place and time.     Cranial Nerves: Cranial nerves are intact.        ASSESSMENT & PLAN:   Aortic valve disease Most recent echocardiogram in 2019 demonstrated mild aortic stenosis and mild aortic insufficiency.  [facility] with flash pulmonary edema.  She has been stable since that time on medical therapy for hypertension.  She has no symptoms to suggest significant worsening of her aortic valve disease.  It has been over 3 years since her last echocardiogram. Obtain echocardiogram Follow-up 6 months  Nonischemic cardiomyopathy (HCC) Ejection fraction improved from 45-50 to normal.  Low EF was thought to be due to uncontrolled hypertension.  As noted, follow-up echocardiogram will be obtained to reassess her aortic valve disease.  Hypertension Blood pressure is uncontrolled.  She did not take any medications today.  She has been out of amlodipine for couple of weeks.  She does note blood  pressures in the 130s-140s at home.  She did have a salty meal last night.   Restart amlodipine 5 mg daily.   Continue carvedilol 6.25 mg twice daily, furosemide 40 mg daily as needed, lisinopril 20 mg daily.   Obtain follow-up CMET.   Monitor blood pressures over the next several weeks.   Follow-up in the Pharm.D. hypertension clinic in 4 weeks.   If needed, adjust lisinopril or  amlodipine.  Hypercholesterolemia Continue rosuvastatin 5 mg daily.  Obtain fasting lipids, CMET today.        Dispo:  Return in about 6 months (around 08/24/2021) for Routine Follow Up, w/ Dr. Johney Frame, or Richardson Dopp, PA-C.   Medication Adjustments/Labs and Tests Ordered: Current medicines are reviewed at length with the patient today.  Concerns regarding medicines are outlined above.  Tests Ordered: Orders Placed This Encounter  Procedures   Comp Met (CMET)   Lipid Profile   AMB Referral to [facility] Pharm-D   EKG 12-Lead   ECHOCARDIOGRAM COMPLETE   Medication Changes: Meds ordered this encounter  Medications   amLODipine (NORVASC) 5 MG tablet    Sig: Take 1 tablet (5 mg total) by mouth daily.    Dispense:  60 tablet    Refill:  6   carvedilol (COREG) 6.25 MG tablet    Sig: Take 1 tablet (6.25 mg total) by mouth 2 (two) times daily.    Dispense:  60 tablet    Refill:  6   furosemide (LASIX) 40 MG tablet    Sig: TAKE 1 TABLET BY MOUTH EVERY DAY AS NEEDED FOR FLUID RETENTION    Dispense:  30 tablet    Refill:  6   Signed, Richardson Dopp, PA-C  02/24/2021 11:38 AM    [address], [place], [place]  [address] Phone: [phone]; Fax: [phone]

## 2021-02-24 ENCOUNTER — Other Ambulatory Visit: Payer: Self-pay

## 2021-02-24 ENCOUNTER — Encounter: Payer: Self-pay | Admitting: Physician Assistant

## 2021-02-24 ENCOUNTER — Ambulatory Visit (INDEPENDENT_AMBULATORY_CARE_PROVIDER_SITE_OTHER): Payer: 59 | Admitting: Physician Assistant

## 2021-02-24 VITALS — BP 180/84 | HR 78 | Ht 67.0 in | Wt 143.4 lb

## 2021-02-24 DIAGNOSIS — I428 Other cardiomyopathies: Secondary | ICD-10-CM

## 2021-02-24 DIAGNOSIS — I350 Nonrheumatic aortic (valve) stenosis: Secondary | ICD-10-CM

## 2021-02-24 DIAGNOSIS — E7800 Pure hypercholesterolemia, unspecified: Secondary | ICD-10-CM

## 2021-02-24 DIAGNOSIS — I351 Nonrheumatic aortic (valve) insufficiency: Secondary | ICD-10-CM

## 2021-02-24 DIAGNOSIS — I10 Essential (primary) hypertension: Secondary | ICD-10-CM

## 2021-02-24 DIAGNOSIS — I359 Nonrheumatic aortic valve disorder, unspecified: Secondary | ICD-10-CM

## 2021-02-24 DIAGNOSIS — E785 Hyperlipidemia, unspecified: Secondary | ICD-10-CM

## 2021-02-24 LAB — LIPID PANEL
Chol/HDL Ratio: 3.2 ratio (ref 0.0–4.4)
Cholesterol, Total: 216 mg/dL — ABNORMAL HIGH (ref 100–199)
HDL: 68 mg/dL (ref >39)
LDL Chol Calc (NIH): 136 mg/dL — ABNORMAL HIGH (ref 0–99)
Triglycerides: 70 mg/dL (ref 0–149)
VLDL Cholesterol Cal: 12 mg/dL (ref 5–40)

## 2021-02-24 LAB — COMPREHENSIVE METABOLIC PANEL
ALT: 16 IU/L (ref 0–32)
AST: 23 IU/L (ref 0–40)
Albumin/Globulin Ratio: 1.1 — ABNORMAL LOW (ref 1.2–2.2)
Albumin: 3.9 g/dL (ref 3.8–4.9)
Alkaline Phosphatase: 119 IU/L (ref 44–121)
BUN/Creatinine Ratio: 14 (ref 9–23)
BUN: 14 mg/dL (ref 6–24)
Bilirubin Total: 0.4 mg/dL (ref 0.0–1.2)
CO2: 24 mmol/L (ref 20–29)
Calcium: 9.5 mg/dL (ref 8.7–10.2)
Chloride: 104 mmol/L (ref 96–106)
Creatinine, Ser: 1.03 mg/dL — ABNORMAL HIGH (ref 0.57–1.00)
Globulin, Total: 3.6 g/dL (ref 1.5–4.5)
Glucose: 91 mg/dL (ref 70–99)
Potassium: 4.9 mmol/L (ref 3.5–5.2)
Sodium: 139 mmol/L (ref 134–144)
Total Protein: 7.5 g/dL (ref 6.0–8.5)
eGFR: 63 mL/min/{1.73_m2} (ref >59)

## 2021-02-24 MED ORDER — ROSUVASTATIN CALCIUM 5 MG PO TABS: 5.0000 mg | ORAL_TABLET | Freq: Every day | ORAL | 6 refills | Status: DC

## 2021-02-24 MED ORDER — LISINOPRIL 20 MG PO TABS: ORAL_TABLET | ORAL | 6 refills | Status: DC

## 2021-02-24 MED ORDER — CARVEDILOL 6.25 MG PO TABS: 6.2500 mg | ORAL_TABLET | Freq: Two times a day (BID) | ORAL | 6 refills | Status: DC

## 2021-02-24 MED ORDER — AMLODIPINE BESYLATE 5 MG PO TABS: 5.0000 mg | ORAL_TABLET | Freq: Every day | ORAL | 6 refills | Status: DC

## 2021-02-24 MED ORDER — FUROSEMIDE 40 MG PO TABS: ORAL_TABLET | ORAL | 6 refills | Status: DC

## 2021-02-24 NOTE — Patient Instructions (Signed)
Medication Instructions:   Your physician recommends that you continue on your current medications as directed. Please refer to the Current Medication list given to you today.  *If you need a refill on your cardiac medications before your next appointment, please call your pharmacy*   Lab Work:  TODAY!!!!  LIPID/CMET  If you have labs (blood work) drawn today and your tests are completely normal, you will receive your results only by: MyChart Message (if you have MyChart) OR A paper copy in the mail If you have any lab test that is abnormal or we need to change your treatment, we will call you to review the results.   Testing/Procedures:  Your physician has requested that you have an echocardiogram. Echocardiography is a painless test that uses sound waves to create images of your heart. It provides your doctor with information about the size and shape of your heart and how well your hearts chambers and valves are working. This procedure takes approximately one hour. There are no restrictions for this procedure.    Follow-Up: At [facility], you and your health needs are our priority.  As part of our continuing mission to provide you with exceptional heart care, we have created designated Provider Care Teams.  These Care Teams include your primary Cardiologist (physician) and Advanced Practice Providers (APPs -  Physician Assistants and Nurse Practitioners) who all work together to provide you with the care you need, when you need it.  We recommend signing up for the patient portal called "MyChart".  Sign up information is provided on this After Visit Summary.  MyChart is used to connect with patients for Virtual Visits (Telemedicine).  Patients are able to view lab/test results, encounter notes, upcoming appointments, etc.  Non-urgent messages can be sent to your provider as well.   To learn more about what you can do with MyChart, go to [email or web address].    Your next  appointment:   6 month(s)  The format for your next appointment:   In Person  Provider:   Meriam Sprague, MD or Tereso Newcomer, PA-C.    Other Instructions  You have been referred to Pharmacist for HTN bring blood pressure cuff and readings to appointment.   Blood Pressure Record Sheet To take your blood pressure, you will need a blood pressure machine. You can buy a blood pressure machine (blood pressure monitor) at your clinic, drug store, or online. When choosing one, consider: An automatic monitor that has an arm cuff. A cuff that wraps snugly around your upper arm. You should be able to fit only one finger between your arm and the cuff. A device that stores blood pressure reading results. Do not choose a monitor that measures your blood pressure from your wrist or finger. Follow your health care provider's instructions for how to take your blood pressure. To use this form: Get one reading in the morning (a.m.) before you take any medicines. Get one reading in the evening (p.m.) before supper. Take at least 2 readings with each blood pressure check. This makes sure the results are correct. Wait 1-2 minutes between measurements. Write down the results in the spaces on this form. Repeat this once a week, or as told by your health care provider. Make a follow-up appointment with your health care provider to discuss the results. Blood pressure log Date: _______________________ a.m. _____________________(1st reading) _____________________(2nd reading) p.m. _____________________(1st reading) _____________________(2nd reading) Date: _______________________ a.m. _____________________(1st reading) _____________________(2nd reading) p.m. _____________________(1st reading) _____________________(2nd reading) Date: _______________________ a.m. _____________________(1st  reading) _____________________(2nd reading) p.m. _____________________(1st reading) _____________________(2nd  reading) Date: _______________________ a.m. _____________________(1st reading) _____________________(2nd reading) p.m. _____________________(1st reading) _____________________(2nd reading) Date: _______________________ a.m. _____________________(1st reading) _____________________(2nd reading) p.m. _____________________(1st reading) _____________________(2nd reading) This information is not intended to replace advice given to you by your health care provider. Make sure you discuss any questions you have with your health care provider. Document Revised: 05/07/2019 Document Reviewed: 05/08/2019 Elsevier Patient Education  2022 [employer].  Your physician wants you to follow-up in: 6 months with Dr. Shari Prows or Tereso Newcomer, PA-C. You will receive a reminder letter in the mail two months in advance. If you don't receive a letter, please call our office to schedule the follow-up appointment.

## 2021-02-24 NOTE — Assessment & Plan Note (Signed)
Most recent echocardiogram in 2019 demonstrated mild aortic stenosis and mild aortic insufficiency.  [facility] with flash pulmonary edema.  She has been stable since that time on medical therapy for hypertension.  She has no symptoms to suggest significant worsening of her aortic valve disease.  It has been over 3 years since her last echocardiogram.  Obtain echocardiogram  Follow-up 6 months

## 2021-02-24 NOTE — Assessment & Plan Note (Signed)
Ejection fraction improved from 45-50 to normal.  Low EF was thought to be due to uncontrolled hypertension.  As noted, follow-up echocardiogram will be obtained to reassess her aortic valve disease.

## 2021-02-24 NOTE — Assessment & Plan Note (Signed)
Continue rosuvastatin 5 mg daily.  Obtain fasting lipids, CMET today.

## 2021-02-24 NOTE — Assessment & Plan Note (Signed)
Blood pressure is uncontrolled.  She did not take any medications today.  She has been out of amlodipine for couple of weeks.  She does note blood pressures in the 130s-140s at home.  She did have a salty meal last night.    Restart amlodipine 5 mg daily.    Continue carvedilol 6.25 mg twice daily, furosemide 40 mg daily as needed, lisinopril 20 mg daily.    Obtain follow-up CMET.    Monitor blood pressures over the next several weeks.    Follow-up in the Pharm.D. hypertension clinic in 4 weeks.    If needed, adjust lisinopril or amlodipine.

## 2021-02-26 ENCOUNTER — Other Ambulatory Visit: Payer: Self-pay | Admitting: *Deleted

## 2021-02-26 DIAGNOSIS — E785 Hyperlipidemia, unspecified: Secondary | ICD-10-CM

## 2021-02-26 DIAGNOSIS — E7800 Pure hypercholesterolemia, unspecified: Secondary | ICD-10-CM

## 2021-02-26 DIAGNOSIS — I350 Nonrheumatic aortic (valve) stenosis: Secondary | ICD-10-CM

## 2021-02-26 DIAGNOSIS — I351 Nonrheumatic aortic (valve) insufficiency: Secondary | ICD-10-CM

## 2021-02-26 MED ORDER — POTASSIUM CHLORIDE CRYS ER 20 MEQ PO TBCR: EXTENDED_RELEASE_TABLET | ORAL | 3 refills | Status: DC

## 2021-02-26 MED ORDER — FUROSEMIDE 40 MG PO TABS: ORAL_TABLET | ORAL | 11 refills | Status: DC

## 2021-02-26 MED ORDER — AMLODIPINE BESYLATE 5 MG PO TABS: 5.0000 mg | ORAL_TABLET | Freq: Every day | ORAL | 11 refills | Status: DC

## 2021-02-26 MED ORDER — ROSUVASTATIN CALCIUM 10 MG PO TABS: 10.0000 mg | ORAL_TABLET | Freq: Every day | ORAL | 11 refills | Status: DC

## 2021-03-19 ENCOUNTER — Ambulatory Visit (INDEPENDENT_AMBULATORY_CARE_PROVIDER_SITE_OTHER): Payer: 59 | Admitting: Pharmacist

## 2021-03-19 ENCOUNTER — Other Ambulatory Visit: Payer: Self-pay

## 2021-03-19 ENCOUNTER — Ambulatory Visit (HOSPITAL_COMMUNITY): Payer: 59 | Attending: Internal Medicine

## 2021-03-19 VITALS — BP 178/92 | HR 83

## 2021-03-19 DIAGNOSIS — I350 Nonrheumatic aortic (valve) stenosis: Secondary | ICD-10-CM | POA: Diagnosis present

## 2021-03-19 DIAGNOSIS — I351 Nonrheumatic aortic (valve) insufficiency: Secondary | ICD-10-CM | POA: Diagnosis present

## 2021-03-19 DIAGNOSIS — I10 Essential (primary) hypertension: Secondary | ICD-10-CM

## 2021-03-19 DIAGNOSIS — I428 Other cardiomyopathies: Secondary | ICD-10-CM | POA: Diagnosis not present

## 2021-03-19 DIAGNOSIS — I359 Nonrheumatic aortic valve disorder, unspecified: Secondary | ICD-10-CM | POA: Insufficient documentation

## 2021-03-19 DIAGNOSIS — E7800 Pure hypercholesterolemia, unspecified: Secondary | ICD-10-CM | POA: Insufficient documentation

## 2021-03-19 DIAGNOSIS — E785 Hyperlipidemia, unspecified: Secondary | ICD-10-CM

## 2021-03-19 LAB — ECHOCARDIOGRAM COMPLETE
AR max vel: 1.69 cm2
AV Area VTI: 1.48 cm2
AV Area mean vel: 1.44 cm2
AV Mean grad: 10.3 mmHg
AV Peak grad: 19.2 mmHg
Ao pk vel: 2.19 m/s
Area-P 1/2: 3.27 cm2
P 1/2 time: 395 msec
S' Lateral: 3.95 cm

## 2021-03-19 MED ORDER — LISINOPRIL 40 MG PO TABS: ORAL_TABLET | ORAL | 11 refills | Status: DC

## 2021-03-19 MED ORDER — AMLODIPINE BESYLATE 10 MG PO TABS: 10.0000 mg | ORAL_TABLET | Freq: Every day | ORAL | 11 refills | Status: DC

## 2021-03-19 NOTE — Progress Notes (Signed)
Patient ID: Wendy Stanley                 DOB: 1965/06/18                      MRN: [id number]     HPI: Wendy Stanley is a 55 y.o. female patient of Dr Wendy Stanley referred by Wendy Newcomer, PA to HTN clinic. PMH is significant for HTN, HLD, NICMP with EF improved to 55% on 10/2017 echo, PVCs, mild aortic insufficiency and mild aortic stenosis. At last visit with Wendy Stanley on 02/24/21, BP was elevated at 180/84. Pt had run out of her amlodipine a few weeks previously and also ate a salty meal the evening prior. She was restarted on her amlodipine and referred to HTN clinic for f/u.  Pt presents today in good spirits. Reports tolerating her medications well. Denies dizziness, headache, and LE edema. Home readings run in the 140s/70s using bicep cuff. Only takes her Lasix a few times a month as needed. Takes her potassium only on days she takes her Lasix. Has taken all her meds so far this morning. Reports some white coat HTN.  Current HTN meds: amlodipine 5mg  daily , lisinopril 20mg  daily, carvedilol 6.25mg  BID, also taking furosemide 40mg  daily prn  BP goal: <130/75mmHg  Family History: The patient's family history includes Hypertension in her father; Kidney failure in her father; Mitral valve prolapse in her sister and sister.   Social History: The patient reports that she has never smoked. She has never used smokeless tobacco. She reports that she does not drink alcohol and does not use drugs.   Diet: Watches her salt. Not much caffeine.  Exercise: 3-4x a week - 35 minute Zoom exercise. Bowls as well.  Wt Readings from Last 3 Encounters:  02/24/21 143 lb 6.4 oz (65 kg)  12/11/19 150 lb 12.8 oz (68.4 kg)  10/22/18 151 lb (68.5 kg)   BP Readings from Last 3 Encounters:  02/24/21 (!) 180/84  12/11/19 140/74  10/22/18 132/62   Pulse Readings from Last 3 Encounters:  02/24/21 78  12/11/19 86  10/22/18 82    Renal function: CrCl cannot be calculated (Patient's most recent lab result  is older than the maximum 21 days allowed.).  Past Medical History:  Diagnosis Date   Aortic insufficiency    Aortic valve regurgitation 10/13/2010   Flash pulmonary edema (HCC) 2012   Frequent PVCs    Hypercholesterolemia 12/27/2011   Mild aortic stenosis    Nonischemic cardiomyopathy (HCC) 02/23/2021   Thought to be due to uncontrolled HTN // Improved to normal  EF in 12/16: 45-50 EF in 4/18: 60-65   Valvular cardiomyopathy (HCC)     Current Outpatient Medications on File Prior to Visit  Medication Sig Dispense Refill   amLODipine (NORVASC) 5 MG tablet Take 1 tablet (5 mg total) by mouth daily. 30 tablet 11   aspirin EC 81 MG tablet Take 81 mg by mouth daily.       B Complex-C (B-COMPLEX WITH VITAMIN C) tablet Take 1 tablet by mouth daily.     carvedilol (COREG) 6.25 MG tablet Take 1 tablet (6.25 mg total) by mouth 2 (two) times daily. 60 tablet 6   furosemide (LASIX) 40 MG tablet TAKE 1 TABLET BY MOUTH EVERY DAY AS NEEDED FOR FLUID RETENTION 30 tablet 11   lisinopril (ZESTRIL) 20 MG tablet TAKE 1 TABLET BY MOUTH EVERY DAY 30 tablet 6   Multiple Vitamin (MULTIVITAMIN) tablet Take 1  tablet by mouth every other day.      potassium chloride SA (KLOR-CON M) 20 MEQ tablet TAKE 1 TABLET(20 MEQ) BY MOUTH DAILY 90 tablet 3   rosuvastatin (CRESTOR) 10 MG tablet Take 1 tablet (10 mg total) by mouth daily. 30 tablet 11   VITAMIN D, CHOLECALCIFEROL, PO Take 1 capsule by mouth daily.     No current facility-administered medications on file prior to visit.    No Known Allergies   Assessment/Plan:  1. Hypertension - BP above goal <130/70mmHg in clinic. Home readings also elevated but not as high as clinic reading today, may be an element of white coat HTN. Will increase amlodipine from 5mg  to 10mg  daily and will increase lisinopril from 20mg  to 40mg  daily. Continue carvedilol 6.25mg  BID. Advised pt to move either amlodipine or lisinopril to PM dosing to spread them apart. She will continue to  monitor BP at home and bring in home cuff and log of readings to next visit in 3 weeks. Will check BMET at that time as well. Encouraged < 2,000mg  sodium/day and 150 minutes of weekly activity.  Wendy Stanley Wendy Stanley, PharmD, BCACP, CPP [facility] [address] [place],  Phone: [phone]; Fax: [phone] 03/19/2021 9:44 AM

## 2021-03-19 NOTE — Patient Instructions (Addendum)
It was nice to meet you today!  Your blood pressure goal is < 130/53mmHg  Increase amlodipine from 5mg  to 10mg  once daily  Increase lisinopril from 20mg  to 40mg  once daily  Continue taking your other medications  Limit daily sodium to < 2,000mg   Aim for 150 minutes of activity each week  Monitor your blood pressure at home. Bring in your readings and your cuff to your next visit

## 2021-03-24 ENCOUNTER — Encounter: Payer: Self-pay | Admitting: *Deleted

## 2021-03-24 ENCOUNTER — Telehealth: Payer: Self-pay | Admitting: Physician Assistant

## 2021-03-24 DIAGNOSIS — I350 Nonrheumatic aortic (valve) stenosis: Secondary | ICD-10-CM

## 2021-03-24 DIAGNOSIS — I428 Other cardiomyopathies: Secondary | ICD-10-CM

## 2021-03-24 MED ORDER — METOPROLOL TARTRATE 100 MG PO TABS: ORAL_TABLET | ORAL | 0 refills | Status: DC

## 2021-03-24 MED ORDER — ENTRESTO 49-51 MG PO TABS: 1.0000 | ORAL_TABLET | Freq: Two times a day (BID) | ORAL | 1 refills | Status: DC

## 2021-03-24 NOTE — Telephone Encounter (Signed)
-----   Message from Beatrice Lecher, [place] sent at 03/23/2021  1:24 PM EST ----- I reviewed her echocardiogram with Dr. Shari Prows. BP was still quite high at her echocardiogram. Her EF is mildly low now and Entresto would be a better drug for this.  It will also be more potent in lowering her BP.   PLAN:  -Please arrange Coronary CTA (Dx Cardiomyopathy) -DC Lisinopril -36 hours after last dose of Lisinopril, start Entresto 49/51 mg twice daily  -Get f/u BMET with the pharmacist at appt scheduled on 04/09/21 -Arrange f/u with me after her CCTA (approx 6 weeks) Tereso Newcomer, PA-C    03/23/2021 1:23 PM

## 2021-03-24 NOTE — Telephone Encounter (Signed)
Pt c/o medication issue:  1. Name of Medication:  sacubitril-valsartan (ENTRESTO) 49-51 MG  2. How are you currently taking this medication (dosage and times per day)?   3. Are you having a reaction (difficulty breathing--STAT)? no  4. What is your medication issue? Patient states her insurance will not pay for entresto. She says her sister paid for the first month for her, because she was told she has to start the medication tomorrow at 8 am.

## 2021-03-24 NOTE — Telephone Encounter (Signed)
Your cardiac CT will be scheduled at one of the below locations:   [facility] [address] [place], [place] [address] [phone]  OR  [facility] [address] [place], [place] [address] [phone]  If scheduled at [facility], please arrive at the [facility] main entrance (entrance A) of [facility] 30 minutes prior to test start time. You can use the FREE valet parking offered at the main entrance (encouraged to control the heart rate for the test) Proceed to the [facility] Radiology Department (first floor) to check-in and test prep.  If scheduled at [facility], please arrive 15 mins early for check-in and test prep.  Please follow these instructions carefully (unless otherwise directed):  Hold all erectile dysfunction medications at least 3 days (72 hrs) prior to test.  On the Night Before the Test: Be sure to Drink plenty of water. Do not consume any caffeinated/decaffeinated beverages or chocolate 12 hours prior to your test. Do not take any antihistamines 12 hours prior to your test. If the patient has contrast allergy: Patient will need a prescription for Prednisone and very clear instructions (as follows): Prednisone 50 mg - take 13 hours prior to test Take another Prednisone 50 mg 7 hours prior to test Take another Prednisone 50 mg 1 hour prior to test Take Benadryl 50 mg 1 hour prior to test Patient must complete all four doses of above prophylactic medications. Patient will need a ride after test due to Benadryl.  On the Day of the Test: Drink plenty of water until 1 hour prior to the test. Do not eat any food 4 hours prior to the test. You may take your regular medications prior to the test.  Take metoprolol (Lopressor) two hours prior to test. HOLD Furosemide/Hydrochlorothiazide morning of the test. FEMALES- please wear underwire-free bra if  available, avoid dresses & tight clothing   *For Clinical Staff only. Please instruct patient the following:* Heart Rate Medication Recommendations for Cardiac CT  Resting HR < 50 bpm  No medication  Resting HR 50-60 bpm and BP >110/50 mmHG   Consider Metoprolol tartrate 25 mg PO 90-120 min prior to scan  Resting HR 60-65 bpm and BP >110/50 mmHG  Metoprolol tartrate 50 mg PO 90-120 minutes prior to scan   Resting HR > 65 bpm and BP >110/50 mmHG  Metoprolol tartrate 100 mg PO 90-120 minutes prior to scan  Consider Ivabradine 10-15 mg PO or a calcium channel blocker for resting HR >60 bpm and contraindication to metoprolol tartrate  Consider Ivabradine 10-15 mg PO in combination with metoprolol tartrate for HR >80 bpm         After the Test: Drink plenty of water. After receiving IV contrast, you may experience a mild flushed feeling. This is normal. On occasion, you may experience a mild rash up to 24 hours after the test. This is not dangerous. If this occurs, you can take Benadryl 25 mg and increase your fluid intake. If you experience trouble breathing, this can be serious. If it is severe call 911 IMMEDIATELY. If it is mild, please call our office. If you take any of these medications: Glipizide/Metformin, Avandament, Glucavance, please do not take 48 hours after completing test unless otherwise instructed.  We will call to schedule your test 2-4 weeks out understanding that some insurance companies will need an authorization prior to the service being performed.   For non-scheduling related  questions, please contact the cardiac imaging nurse navigator should you have any questions/concerns: Rockwell Alexandria, Cardiac Imaging Nurse Navigator Larey Brick, Cardiac Imaging Nurse Navigator [facility] Direct Office Dial: [phone]   For scheduling needs, including cancellations and rescheduling, please call [place], [phone].

## 2021-03-24 NOTE — Telephone Encounter (Signed)
° °  Your cardiac CT will be scheduled at one of the below locations:   [facility] [address] [place], [place] [address] [phone]  Please arrive at the [facility] main entrance (entrance A) of [facility] 30 minutes prior to test start time. You can use the FREE valet parking offered at the main entrance (encouraged to control the heart rate for the test) Proceed to the [facility] Radiology Department (first floor) to check-in and test prep.  Please follow these instructions carefully (unless otherwise directed):  COME TO THE LAB ON Monday, 03/29/21, ANYTIME BETWEEN 7:15 -5:00   On the Night Before the Test: Be sure to Drink plenty of water. Do not consume any caffeinated/decaffeinated beverages or chocolate 12 hours prior to your test. Do not take any antihistamines 12 hours prior to your test.   On the Day of the Test: Drink plenty of water until 1 hour prior to the test. Do not eat any food 4 hours prior to the test. You may take your regular medications prior to the test.  Take metoprolol (Lopressor) 100 mg two hours prior to test. [facility] to [facility]  HOLD Furosemide morning of the test. FEMALES- please wear underwire-free bra if available, avoid dresses & tight clothing     After the Test: Drink plenty of water. After receiving IV contrast, you may experience a mild flushed feeling. This is normal. On occasion, you may experience a mild rash up to 24 hours after the test. This is not dangerous. If this occurs, you can take Benadryl 25 mg and increase your fluid intake. If you experience trouble breathing, this can be serious. If it is severe call 911 IMMEDIATELY. If it is mild, please call our office.   We will call to schedule your test 2-4 weeks out understanding that some insurance companies will need an authorization prior to the service being performed.   For non-scheduling related questions, please contact the cardiac imaging  nurse navigator should you have any questions/concerns: Rockwell Alexandria, Cardiac Imaging Nurse Navigator Larey Brick, Cardiac Imaging Nurse Navigator [facility] Direct Office Dial: [phone]   For scheduling needs, including cancellations and rescheduling, please call [place], [phone].

## 2021-03-24 NOTE — Telephone Encounter (Signed)
Pt has been made aware of her results.  See result note.

## 2021-03-24 NOTE — Telephone Encounter (Signed)
° °  Pt is returning call to get echo result °

## 2021-03-24 NOTE — Telephone Encounter (Signed)
**Note De-Identified Wendy Stanley Obfuscation** I called [facility] and was advised that a PA is not required for the pts Entresto but that she has a high deductible.  I called the pt and made her aware of this and I advised her that since she has commercial ins coverage for her medications that there is a $10 Entresto co-pay card and that if she uses one she will get her Entresto for $10 each month. I recommended that she go online at www.entresto.com to get the co-pay card.  She stated that this has been very upsetting for her as she was shocked when she was told how much Entresto cost and she was advised that she had to start taking it tomorrow night so she had no choice but to ask her sister to pay [phone] for a 30 day supply of Entresto.  She states that she will go online to get the [place] co-pay card info and that she will take the card with her to the [facility] pharmacy for reimbursement of her sister's money.  She thanked me for calling her back and states that she will call us back if the co-pay is not an option for her.

## 2021-03-29 ENCOUNTER — Other Ambulatory Visit: Payer: 59 | Admitting: *Deleted

## 2021-03-29 ENCOUNTER — Other Ambulatory Visit: Payer: Self-pay

## 2021-03-29 DIAGNOSIS — I428 Other cardiomyopathies: Secondary | ICD-10-CM

## 2021-03-29 DIAGNOSIS — I350 Nonrheumatic aortic (valve) stenosis: Secondary | ICD-10-CM

## 2021-03-29 LAB — BASIC METABOLIC PANEL WITH GFR
BUN/Creatinine Ratio: 17 (ref 9–23)
BUN: 16 mg/dL (ref 6–24)
CO2: 23 mmol/L (ref 20–29)
Calcium: 8.9 mg/dL (ref 8.7–10.2)
Chloride: 108 mmol/L — ABNORMAL HIGH (ref 96–106)
Creatinine, Ser: 0.95 mg/dL (ref 0.57–1.00)
Glucose: 93 mg/dL (ref 70–99)
Potassium: 4.5 mmol/L (ref 3.5–5.2)
Sodium: 141 mmol/L (ref 134–144)
eGFR: 70 mL/min/1.73 (ref >59)

## 2021-04-06 ENCOUNTER — Telehealth (HOSPITAL_COMMUNITY): Payer: Self-pay | Admitting: Emergency Medicine

## 2021-04-06 NOTE — Telephone Encounter (Signed)
Reaching out to patient to offer assistance regarding upcoming cardiac imaging study; pt verbalizes understanding of appt date/time, parking situation and where to check in, pre-test NPO status and medications ordered, and verified current allergies; name and call back number provided for further questions should they arise ?Dmiyah Liscano RN Navigator Cardiac Imaging ?[facility] ?[phone] office ?[phone] cell ? ? ?Denies iv issues ?100mg metoprolol tartrate  ?Arrival 1230 ? ?

## 2021-04-07 ENCOUNTER — Ambulatory Visit (HOSPITAL_COMMUNITY)
Admission: RE | Admit: 2021-04-07 | Discharge: 2021-04-07 | Disposition: A | Discharge status: Home / Self Care | Payer: 59 | Source: Ambulatory Visit | Attending: Physician Assistant | Admitting: Physician Assistant

## 2021-04-07 ENCOUNTER — Other Ambulatory Visit: Payer: Self-pay

## 2021-04-07 DIAGNOSIS — I428 Other cardiomyopathies: Secondary | ICD-10-CM | POA: Diagnosis present

## 2021-04-07 DIAGNOSIS — I350 Nonrheumatic aortic (valve) stenosis: Secondary | ICD-10-CM

## 2021-04-07 MED ORDER — NITROGLYCERIN 0.4 MG SL SUBL
SUBLINGUAL_TABLET | SUBLINGUAL | Status: AC
  Filled 2021-04-07: qty 2

## 2021-04-07 MED ORDER — NITROGLYCERIN 0.4 MG SL SUBL
0.8000 mg | SUBLINGUAL_TABLET | Freq: Once | SUBLINGUAL | Status: AC
  Administered 2021-04-07: 0.8 mg via SUBLINGUAL

## 2021-04-07 MED ORDER — IOHEXOL 350 MG/ML SOLN
95.0000 mL | Freq: Once | INTRAVENOUS | Status: AC | PRN
  Administered 2021-04-07: 95 mL via INTRAVENOUS

## 2021-04-07 NOTE — Progress Notes (Signed)
CT scan completed. Tolerated well. D/C home ambulatory, awake and alert. In no distress. 

## 2021-04-09 ENCOUNTER — Other Ambulatory Visit: Payer: 59 | Admitting: *Deleted

## 2021-04-09 ENCOUNTER — Other Ambulatory Visit: Payer: Self-pay

## 2021-04-09 ENCOUNTER — Ambulatory Visit (INDEPENDENT_AMBULATORY_CARE_PROVIDER_SITE_OTHER): Payer: 59 | Admitting: Pharmacist

## 2021-04-09 VITALS — BP 144/84 | HR 73 | Wt 146.0 lb

## 2021-04-09 DIAGNOSIS — I351 Nonrheumatic aortic (valve) insufficiency: Secondary | ICD-10-CM

## 2021-04-09 DIAGNOSIS — I428 Other cardiomyopathies: Secondary | ICD-10-CM

## 2021-04-09 DIAGNOSIS — I350 Nonrheumatic aortic (valve) stenosis: Secondary | ICD-10-CM

## 2021-04-09 DIAGNOSIS — I10 Essential (primary) hypertension: Secondary | ICD-10-CM

## 2021-04-09 LAB — BASIC METABOLIC PANEL
BUN/Creatinine Ratio: 19 (ref 9–23)
BUN: 17 mg/dL (ref 6–24)
CO2: 24 mmol/L (ref 20–29)
Calcium: 9.3 mg/dL (ref 8.7–10.2)
Chloride: 105 mmol/L (ref 96–106)
Creatinine, Ser: 0.88 mg/dL (ref 0.57–1.00)
Glucose: 102 mg/dL — ABNORMAL HIGH (ref 70–99)
Potassium: 4.9 mmol/L (ref 3.5–5.2)
Sodium: 140 mmol/L (ref 134–144)
eGFR: 77 mL/min/{1.73_m2} (ref >59)

## 2021-04-09 MED ORDER — CARVEDILOL 12.5 MG PO TABS: 12.5000 mg | ORAL_TABLET | Freq: Two times a day (BID) | ORAL | 5 refills | Status: DC

## 2021-04-09 NOTE — Patient Instructions (Addendum)
It was nice to see you today! ? ?Your blood pressure goal is < 130/75mmHg ? ?Increase your carvedilol from 6.25mg  to 12.5mg  twice daily ? ?We'll call you tomorrow with your lab results and hopefully replace your amlodipine with a medicine called spironolactone ?

## 2021-04-09 NOTE — Progress Notes (Signed)
Patient ID: Wendy Stanley                 DOB: [birth year]                      MRN: [id number] ? ? ? ? ?HPI: ?Wendy Stanley is a 56 y.o. female patient of Dr Shari Prows referred by Tereso Newcomer, PA to HTN clinic. PMH is significant for HTN, HLD, NICMP with EF improved to 55% on 10/2017 echo, PVCs, mild aortic insufficiency and mild aortic stenosis. At last visit with [facility] on 02/24/21, BP was elevated at 180/84. Pt had run out of her amlodipine a few weeks previously and also ate a salty meal the evening prior. She was restarted on her amlodipine and referred to HTN clinic. I saw pt on 03/19/21 and BP remained elevated at 178/92. I increased her amlodipine to 10mg  and lisinopril to 40mg . Echo 03/19/21 showed EF of 40-45% and she was changed from lisinopril to [facility]. Her first copay was $647 as she was not set up with a copay card initially. Coronary CTA 04/07/21 showed calcium score of 0. ? ?Pt presents today for follow up. She was able to set up a $10 copay card for [facility] and the pharmacy reimbursed her the > $600 she spent on the initial prescription. Has been tolerating her medications well. Denies dizziness, headache, LE edema, SOB, and CP. Took her AM meds about 1.5 hours ago. Home SBP readings in the upper 120s-130s/70s. Hasn't taken a dose of prn Lasix since December. Reports some white coat HTN. ? ?Current HTN meds: amlodipine 10mg  daily, Entresto 49-51mg  BID, carvedilol 6.25mg  BID, also taking furosemide 40mg  prn ? ?BP goal: <130/43mmHg ? ?Family History: The patient's family history includes Hypertension in her father; Kidney failure in her father; Mitral valve prolapse in her sister and sister.  ? ?Social History: The patient reports that she has never smoked. She has never used smokeless tobacco. She reports that she does not drink alcohol and does not use drugs.  ? ?Diet: Watches her salt. Not much caffeine. ? ?Exercise: 3-4x a week - 35 minute Zoom exercise. Bowls as well. ? ?Wt Readings from  Last 3 Encounters:  ?02/24/21 143 lb 6.4 oz (65 kg)  ?12/11/19 150 lb 12.8 oz (68.4 kg)  ?10/22/18 151 lb (68.5 kg)  ? ?BP Readings from Last 3 Encounters:  ?04/07/21 119/66  ?03/19/21 (!) 178/92  ?02/24/21 (!) 180/84  ? ?Pulse Readings from Last 3 Encounters:  ?03/19/21 83  ?02/24/21 78  ?12/11/19 86  ? ? ?Renal function: ?CrCl cannot be calculated (Unknown ideal weight.). ? ?Past Medical History:  ?Diagnosis Date  ? Aortic insufficiency   ? Aortic valve regurgitation 10/13/2010  ? Flash pulmonary edema (HCC) 2012  ? Frequent PVCs   ? Hypercholesterolemia 12/27/2011  ? Mild aortic stenosis   ? Nonischemic cardiomyopathy (HCC) 02/23/2021  ? Thought to be due to uncontrolled HTN // Improved to normal  EF in 12/16: 45-50 EF in 4/18: 60-65  ? Valvular cardiomyopathy (HCC)   ? ? ?Current Outpatient Medications on File Prior to Visit  ?Medication Sig Dispense Refill  ? amLODipine (NORVASC) 10 MG tablet Take 1 tablet (10 mg total) by mouth daily. 30 tablet 11  ? aspirin EC 81 MG tablet Take 81 mg by mouth daily.      ? B Complex-C (B-COMPLEX WITH VITAMIN C) tablet Take 1 tablet by mouth daily.    ? carvedilol (COREG) 6.25 MG tablet Take 1  tablet (6.25 mg total) by mouth 2 (two) times daily. 60 tablet 6  ? furosemide (LASIX) 40 MG tablet TAKE 1 TABLET BY MOUTH EVERY DAY AS NEEDED FOR FLUID RETENTION 30 tablet 11  ? metoprolol tartrate (LOPRESSOR) 100 MG tablet TAKE 1 TABLET BY MOUTH 2 HOURS BEFORE YOUR CARDIAC CT 1 tablet 0  ? Multiple Vitamin (MULTIVITAMIN) tablet Take 1 tablet by mouth every other day.     ? potassium chloride SA (KLOR-CON M) 20 MEQ tablet TAKE 1 TABLET(20 MEQ) BY MOUTH DAILY (Patient taking differently: Take 20 mEq by mouth as needed. Only take with furosemide) 90 tablet 3  ? rosuvastatin (CRESTOR) 10 MG tablet Take 1 tablet (10 mg total) by mouth daily. 30 tablet 11  ? sacubitril-valsartan (ENTRESTO) 49-51 MG Take 1 tablet by mouth 2 (two) times daily. 60 tablet 1  ? VITAMIN D, CHOLECALCIFEROL, PO Take 1  capsule by mouth daily.    ? ?No current facility-administered medications on file prior to visit.  ? ? ?No Known Allergies ? ? ?Assessment/Plan: ? ?1. Hypertension - BP improved but remains above goal <130/22mmHg in clinic. BMET being checked today given recent switch from lisinopril to [facility] after her echo showed EF of 40-45%. Will increase carvedilol from 6.25mg  BID to 12.5mg  BID. If her labs are stable, will plan to stop amlodipine and replace with spironolactone 25mg  daily for continued BP lowering but better CHF data. Would benefit from future Entresto dose titration and addition of SGLT2i at future visit. F/u visit will be scheduled pending lab results and finalized med plan. She does see Scott for follow up in about a month. Of note, will need copay card set up if she's prescribed SGLT2i as her Entresto rx before she was set up with a copay card cost her > $600. ? ?Lorianna Spadaccini E. Casson Catena, PharmD, BCACP, CPP ?[facility] ?[address] [place], [place] ?Phone: [phone]; Fax: [phone] ?04/09/2021 7:12 AM ? ? ?

## 2021-04-12 ENCOUNTER — Telehealth: Payer: Self-pay | Admitting: Pharmacist

## 2021-04-12 NOTE — Progress Notes (Signed)
Pt has been made aware of normal result and verbalized understanding.  jw

## 2021-04-12 NOTE — Telephone Encounter (Signed)
K increased from 4.5 to 4.9 after changing from lisinopril to [facility]. Unable to change amlodipine to spironolactone, pt will continue with dose increase of carvedilol as discussed at visit on Friday and keep next follow up appt with Scott. ? ?

## 2021-04-15 ENCOUNTER — Ambulatory Visit
Admission: EM | Admit: 2021-04-15 | Discharge: 2021-04-15 | Disposition: A | Discharge status: Home / Self Care | Payer: 59 | Attending: Emergency Medicine | Admitting: Emergency Medicine

## 2021-04-15 ENCOUNTER — Other Ambulatory Visit: Payer: Self-pay

## 2021-04-15 DIAGNOSIS — S60222A Contusion of left hand, initial encounter: Secondary | ICD-10-CM | POA: Diagnosis not present

## 2021-04-15 DIAGNOSIS — S6992XA Unspecified injury of left wrist, hand and finger(s), initial encounter: Secondary | ICD-10-CM | POA: Diagnosis not present

## 2021-04-15 NOTE — Discharge Instructions (Addendum)
Please keep your hand wrapped in the Ace wrap around the clock, only remove when you need to bathe or wash your hands. ? ?Feel free to apply an ice pack to your hand for 20 minutes every few hours, Voltaren gel to your hand 4 times daily and/or take ibuprofen 600 mg 3 times daily as needed for pain.  You can certainly do all 3 of these together for best relief. ? ?Please go to the [facility] location to have x-ray of your left hand performed.  The x-ray report typically takes about an hour and we will contact you with the results.   ? ?If there are broken bone seen on x-ray, recommend that she go to an orthopedic urgent care clinic such as [facility], for further evaluation and possible treatment.  I have provided you with their address.  You do not need to call ahead, they accept walk-in patients only at their urgent care clinic. ?

## 2021-04-15 NOTE — ED Triage Notes (Signed)
Pt states she hit her left hand on a ladder at work, she reports having some tenderness to left hand.  ?

## 2021-04-15 NOTE — ED Provider Notes (Signed)
[facility]    CSN: [id number] Arrival date & time: 04/15/21  1101    HISTORY   Chief Complaint  Patient presents with   Hand Pain   HPI Wendy Stanley is a 56 y.o. female. Patient states that a ladder fell at work and she attempted to catch it with her left hand but instead of catching it, it hit the back of her left hand.  Patient states initially she had significant swelling of the back of her left hand which has somewhat improved today after she wrapped it tightly in a sock overnight.  Patient states she continues to have redness and pain in her left hand.  Patient demonstrates that she is able to perform full range of motion with her hand but the area on the back of her hand is red, swollen and tender to touch.    The history is provided by the patient.  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    Home Medications    Prior to Admission medications   Medication Sig Start Date End Date Taking? Authorizing Provider  amLODipine (NORVASC) 10 MG tablet Take 1 tablet (10 mg total) by mouth daily. 03/19/21   Meriam Sprague, MD  aspirin EC 81 MG tablet Take 81 mg by mouth  daily.      [provider]  B Complex-C (B-COMPLEX WITH VITAMIN C) tablet Take 1 tablet by mouth daily.    [provider]  carvedilol (COREG) 12.5 MG tablet Take 1 tablet (12.5 mg total) by mouth 2 (two) times daily. 04/09/21   Meriam Sprague, MD  furosemide (LASIX) 40 MG tablet TAKE 1 TABLET BY MOUTH EVERY DAY AS NEEDED FOR FLUID RETENTION Patient taking differently: Take 40 mg by mouth as needed. 02/26/21 02/26/22  Tereso Newcomer T, PA-C  Multiple Vitamin (MULTIVITAMIN) tablet Take 1 tablet by mouth every other day.     [provider]  rosuvastatin (CRESTOR) 10 MG tablet Take 1 tablet (10 mg total) by mouth daily. 02/26/21 02/26/22  Tereso Newcomer T, PA-C  sacubitril-valsartan (ENTRESTO) 49-51 MG Take 1 tablet by mouth 2 (two) times daily. 03/24/21   Tereso Newcomer T, PA-C  VITAMIN D, CHOLECALCIFEROL, PO Take 1 capsule by mouth daily.    [provider]    Family History Family History  Problem Relation Age of Onset   Hypertension Father    Kidney failure Father    Mitral valve prolapse Sister    Mitral valve prolapse Sister    Social History Social History   Tobacco Use   Smoking status: Never   Smokeless tobacco: Never  Substance Use Topics   Alcohol  use: No   Drug use: No   Allergies   Patient has no known allergies.  Review of Systems Review of Systems Pertinent findings noted in history of present illness.   Physical Exam Triage Vital Signs ED Triage Vitals  Enc Vitals Group     BP 11/27/20 0827 (!) 147/82     Pulse Rate 11/27/20 0827 72     Resp 11/27/20 0827 18     Temp 11/27/20 0827 98.3 F (36.8 C)     Temp Source 11/27/20 0827 Oral     SpO2 11/27/20 0827 98 %     Weight --      Height --      Head Circumference --      Peak Flow --      Pain Score 11/27/20 0826 5     Pain Loc --      Pain Edu? --      Excl. in GC? --   No data found.  Updated Vital Signs BP (!) 145/80 (BP Location: Left Arm)   Pulse 80   Temp  98.7 F (37.1 C) (Oral)   Resp 18   LMP 02/04/2012 Comment: pre menopausal  SpO2 97%   Physical Exam Vitals and nursing note reviewed.  Constitutional:      General: She is not in acute distress.    Appearance: Normal appearance. She is not ill-appearing.  HENT:     Head: Normocephalic and atraumatic.  Eyes:     General: Lids are normal.        Right eye: No discharge.        Left eye: No discharge.     Extraocular Movements: Extraocular movements intact.     Conjunctiva/sclera: Conjunctivae normal.     Right eye: Right conjunctiva is not injected.     Left eye: Left conjunctiva is not injected.  Neck:     Trachea: Trachea and phonation normal.  Cardiovascular:     Rate and Rhythm: Normal rate and regular rhythm.     Pulses: Normal pulses.     Heart sounds: Normal heart sounds. No murmur heard.   No friction rub. No gallop.  Pulmonary:     Effort: Pulmonary effort is normal. No accessory muscle usage, prolonged expiration or respiratory distress.     Breath sounds: Normal breath sounds. No stridor, decreased air movement or transmitted upper airway sounds. No decreased breath sounds, wheezing, rhonchi or rales.  Chest:     Chest wall: No tenderness.  Musculoskeletal:        General: Swelling and tenderness present. Normal range of motion.     Cervical back: Normal range of motion and neck supple. Normal range of motion.     Comments: Patient has significant loss of grip strength in her left hand.  Lymphadenopathy:     Cervical: No cervical adenopathy.  Skin:    General: Skin is warm and dry.     Findings: No erythema or rash.  Neurological:     General: No focal deficit present.     Mental Status: She is alert and oriented to person, place, and time.  Psychiatric:        Mood and Affect: Mood normal.        Behavior: Behavior normal.    Visual Acuity Right Eye Distance:   Left Eye Distance:   Bilateral Distance:    Right Eye Near:   Left Eye Near:    Bilateral  Near:     UC Couse /  Diagnostics / Procedures:    EKG  Radiology No results found.  Procedures Procedures (including critical care time)  UC Diagnoses / Final Clinical Impressions(s)   I have reviewed the triage vital signs and the nursing notes.  Pertinent labs & imaging results that were available during my care of the patient were reviewed by me and considered in my medical decision making (see chart for details).    Final diagnoses:  Hand injury, left, initial encounter  Contusion of left hand, initial encounter   Because of significant loss of grip strength, recommend patient have an x-ray performed of her hand.  Patient was placed in an Ace wrap.  Patient was offered ibuprofen for her pain which she politely declined not wishing to taking medication that might increase her blood pressure.  Patient was advised she can use ice and Voltaren gel as needed for pain.  Patient also provided with information about orthopedic urgent care clinic should the x-ray demonstrate broken bones.  Return precautions advised.  ED Prescriptions   None    PDMP not reviewed this encounter.  Pending results:  Labs Reviewed - No data to display  Medications Ordered in UC: Medications - No data to display  Disposition Upon Discharge:  Condition: stable for discharge home Home: take medications as prescribed; routine discharge instructions as discussed; follow up as advised.  Patient presented with an acute illness with associated systemic symptoms and significant discomfort requiring urgent management. In my opinion, this is a condition that a prudent lay person (someone who possesses an average knowledge of health and medicine) may potentially expect to result in complications if not addressed urgently such as respiratory distress, impairment of bodily function or dysfunction of bodily organs.   Routine symptom specific, illness specific and/or disease specific instructions were discussed with  the patient and/or caregiver at length.   As such, the patient has been evaluated and assessed, work-up was performed and treatment was provided in alignment with urgent care protocols and evidence based medicine.  Patient/parent/caregiver has been advised that the patient may require follow up for further testing and treatment if the symptoms continue in spite of treatment, as clinically indicated and appropriate.  If the patient was tested for COVID-19, Influenza and/or RSV, then the patient/parent/guardian was advised to isolate at home pending the results of his/her diagnostic coronavirus test and potentially longer if theyre positive. I have also advised pt that if his/her COVID-19 test returns positive, it's recommended to self-isolate for at least 10 days after symptoms first appeared AND until fever-free for 24 hours without fever reducer AND other symptoms have improved or resolved. Discussed self-isolation recommendations as well as instructions for household member/close contacts as per the [facility] and [place] DHHS, and also gave patient the COVID packet with this information.  Patient/parent/caregiver has been advised to return to the [facility] or PCP in 3-5 days if no better; to PCP or the Emergency Department if new signs and symptoms develop, or if the current signs or symptoms continue to change or worsen for further workup, evaluation and treatment as clinically indicated and appropriate  The patient will follow up with their current PCP if and as advised. If the patient does not currently have a PCP we will assist them in obtaining one.   The patient may need specialty follow up if the symptoms continue, in spite of conservative treatment and management, for further workup, evaluation, consultation and treatment as clinically indicated and appropriate.   Patient/parent/caregiver verbalized understanding and agreement of  plan as discussed.  All questions were addressed during visit.  Please see  discharge instructions below for further details of plan.  Discharge Instructions:   Discharge Instructions      Please keep your hand wrapped in the Ace wrap around the clock, only remove when you need to bathe or wash your hands.  Feel free to apply an ice pack to your hand for 20 minutes every few hours, Voltaren gel to your hand 4 times daily and/or take ibuprofen 600 mg 3 times daily as needed for pain.  You can certainly do all 3 of these together for best relief.  Please go to the [facility] location to have x-ray of your left hand performed.  The x-ray report typically takes about an hour and we will contact you with the results.    If there are broken bone seen on x-ray, recommend that she go to an orthopedic urgent care clinic such as [facility], for further evaluation and possible treatment.  I have provided you with their address.  You do not need to call ahead, they accept walk-in patients only at their urgent care clinic.      This office note has been dictated using Teaching laboratory technician.  Unfortunately, and despite my best efforts, this method of dictation can sometimes lead to occasional typographical or grammatical errors.  I apologize in advance if this occurs.     Theadora Rama Scales, PA-C 04/15/21 1228

## 2021-05-05 ENCOUNTER — Ambulatory Visit: Payer: 59 | Admitting: Cardiology

## 2021-05-05 ENCOUNTER — Ambulatory Visit: Payer: 59 | Admitting: Physician Assistant

## 2021-05-06 NOTE — Progress Notes (Signed)
?Cardiology Office Note:   ? ?Date:  05/07/2021  ? ?ID:  Wendy Stanley, DOB 1965/02/26, MRN [id number] ? ?PCP:  Patient, No Pcp Per (Inactive)  ?[facility] Providers ?Cardiologist:  Meriam Sprague, MD ?Cardiology APP:  Beatrice Lecher, PA-C    ?Referring MD: No ref. provider found  ? ?Chief Complaint:  Follow-up for cardiomyopathy, aortic valve disease ?  ? ?Patient Profile: ?Specialty Problems   ? ?  ? Cardiology Problems  ? Essential hypertension  ? Hypercholesterolemia  ? Aortic valve disease  ?  Aortic insufficiency and Aortic Stenosis ?Pt admx to [facility] in 2002 w flash pulmonary edema  ?Stable on ACE and Furosemide  ?Echocardiogram 03/2021: EF 40-45, global HK, mod LVH, Gr 1 DD, normal RVSF, mild MR, mild to mod AI, mean AV 10.3 mmHg (Vmax 219 cm/s, DI 0.39).   ?Echo 10/23/2017: GR 1 DD, small AV gradient (mean 11), mild AI, mild MR ?Echo 05/04/2016: Mild LVH, EF 60-65, no RWMA, GR 1 DD, mild aortic stenosis (mean gradient 14 mmHg), moderate AI, trivial MR ?Echo 01/01/2015: Mild LVH, EF 45-50, diffuse HK, GR 1 DD, mild aortic stenosis (mean gradient 9), moderate AI, mild MR ?Echo (09/28/10):  EF 55% to 60%. Wall motion was normal; Grade 1 diastolic dysfunction, mild aortic stenosis (Mean gradient: 60mm Hg (S). Peak gradient: 67mm Hg). Moderate aortic regurgitation. ?  ?  ? Nonischemic cardiomyopathy (HCC)  ?  Thought to be due to uncontrolled HTN // Improved to normal  ?EF in 12/16: 45-50 ?EF in 4/18: 60-65 ?Echocardiogram 2/23: EF 40-45  ?CCTA 3/23: CAC score 0; no CAD  ?  ?  ? PVC's (premature ventricular contractions)  ?  ? ?History of Present Illness:   ?Wendy Stanley is a 56 y.o. female with the above problem list.   She was last seen in January 2023.  She was out of her amlodipine and her blood pressure was uncontrolled.  A follow-up echocardiogram demonstrated worsening LV function with an EF of 40-45.  Her lisinopril was transitioned to [place].  She was set up for coronary CTA to rule out  coronary artery disease.  CCTA in March 2023 demonstrated a calcium score of 0 and no CAD.  She has been seen in the Pharm.D. hypertension clinic with adjustments in her GDMT.  She returns for follow-up.  She is here alone.  She is doing well without chest pain, shortness of breath, syncope, orthopnea, leg edema.  Blood pressures at home have been optimal.     ?   ?Past Medical History:  ?Diagnosis Date  ? Aortic insufficiency   ? Aortic valve regurgitation 10/13/2010  ? Flash pulmonary edema (HCC) 2012  ? Frequent PVCs   ? Hypercholesterolemia 12/27/2011  ? Mild aortic stenosis   ? Nonischemic cardiomyopathy (HCC) 02/23/2021  ? Thought to be due to uncontrolled HTN // Improved to normal  EF in 12/16: 45-50 EF in 4/18: 60-65  ? Valvular cardiomyopathy (HCC)   ? ?Current Medications: ?Current Meds  ?Medication Sig  ? amLODipine (NORVASC) 10 MG tablet Take 1 tablet (10 mg total) by mouth daily.  ? aspirin EC 81 MG tablet Take 81 mg by mouth daily.    ? B Complex-C (B-COMPLEX WITH VITAMIN C) tablet Take 1 tablet by mouth daily.  ? carvedilol (COREG) 12.5 MG tablet Take 1 tablet (12.5 mg total) by mouth 2 (two) times daily.  ? furosemide (LASIX) 40 MG tablet TAKE 1 TABLET BY MOUTH EVERY DAY AS NEEDED FOR FLUID RETENTION  ?  Multiple Vitamin (MULTIVITAMIN) tablet Take 1 tablet by mouth every other day.   ? rosuvastatin (CRESTOR) 10 MG tablet Take 1 tablet (10 mg total) by mouth daily.  ? VITAMIN D, CHOLECALCIFEROL, PO Take 1 capsule by mouth daily.  ? [DISCONTINUED] sacubitril-valsartan (ENTRESTO) 49-51 MG Take 1 tablet by mouth 2 (two) times daily.  ?  ?Allergies:   Patient has no known allergies.  ? ?Social History  ? ?Tobacco Use  ? Smoking status: Never  ? Smokeless tobacco: Never  ?Substance Use Topics  ? Alcohol use: No  ? Drug use: No  ?  ?Family Hx: ?The patient's family history includes Hypertension in her father; Kidney failure in her father; Mitral valve prolapse in her sister and sister. ? ?ROS -see  HPI ? ?EKGs/Labs/Other Test Reviewed:   ? ?EKG:  EKG is not ordered today.  The ekg ordered today demonstrates n/a ? ?Recent Labs: ?02/24/2021: ALT 16 ?04/09/2021: BUN 17; Creatinine, Ser 0.88; Potassium 4.9; Sodium 140  ? ?Recent Lipid Panel ?Recent Labs  ?  02/24/21 ?1101  ?CHOL 216*  ?TRIG 70  ?HDL 68  ?LDLCALC 136*  ?  ? ?Risk Assessment/Calculations:   ?  ?    ?Physical Exam:   ? ?VS:  BP 110/60   Pulse 80   Ht 5\' 7"  (1.702 m)   Wt 148 lb 3.2 oz (67.2 kg)   LMP 02/04/2012 Comment: pre menopausal  SpO2 97%   BMI 23.21 kg/m?    ? ?Wt Readings from Last 3 Encounters:  ?05/07/21 148 lb 3.2 oz (67.2 kg)  ?04/09/21 146 lb (66.2 kg)  ?02/24/21 143 lb 6.4 oz (65 kg)  ?  ?Constitutional:   ?   Appearance: Healthy appearance. Not in distress.  ?Neck:  ?   Vascular: No JVR. JVD normal.  ?Pulmonary:  ?   Effort: Pulmonary effort is normal.  ?   Breath sounds: No wheezing. No rales.  ?Cardiovascular:  ?   Normal rate. Regular rhythm. Normal S1. Normal S2.   ?   Murmurs: There is a grade 2/6 crescendo-decrescendo systolic murmur at the URSB. There is a grade 2/4 decrescendo diastolic murmur at the LLSB.  ?Edema: ?   Peripheral edema absent.  ?Abdominal:  ?   Palpations: Abdomen is soft.  ?Skin: ?   General: Skin is warm and dry.  ?Neurological:  ?   Mental Status: Alert and oriented to person, place and time.  ?   Cranial Nerves: Cranial nerves are intact.  ?  ?    ?ASSESSMENT & PLAN:   ?Aortic valve disease ?Most recent echocardiogram with mild to moderate AI and mean aortic valve gradient of 10.3 mmHg.  She will need a follow-up echocardiogram in 1 year. ? ?Essential hypertension ?Blood pressure is much better controlled and is now optimal.  Continue amlodipine 10 mg daily, carvedilol 12.5 mg twice daily, Entresto 49/51 mg twice daily ? ?Hypercholesterolemia ?Continue rosuvastatin 10 mg daily.  Here for follow-up lipids planned for later this month. ? ?Nonischemic cardiomyopathy (HCC) ?Coronary CTA with calcium score  of 0 and no CAD.  She is not having symptoms of volume excess.  She only takes furosemide as needed for leg edema.  Her potassium was high normal.  Therefore, she was not placed on spironolactone.  At this point, I do not think she needs SGLT2 inhibitor.  Continue Entresto 49/51 mg twice daily, carvedilol 12.5 mg twice daily.  Obtain limited echo in 2 months.  If EF is lower, add SGLT2  inhibitor. ? ?     ?   ?Dispo:  Return in about 6 months (around 11/06/2021) for Routine Follow Up, w/ Dr. Shari Prows, or Tereso Newcomer, PA-C.  ? ?Medication Adjustments/Labs and Tests Ordered: ?Current medicines are reviewed at length with the patient today.  Concerns regarding medicines are outlined above.  ?Tests Ordered: ?Orders Placed This Encounter  ?Procedures  ? ECHOCARDIOGRAM LIMITED  ? ?Medication Changes: ?Meds ordered this encounter  ?Medications  ? sacubitril-valsartan (ENTRESTO) 49-51 MG  ?  Sig: Take 1 tablet by mouth 2 (two) times daily.  ?  Dispense:  60 tablet  ?  Refill:  11  ? ?Signed, ?Tereso Newcomer, PA-C  ?05/07/2021 10:38 AM    ?[facility] ?[address] [place], [place], [place]  [address] ?Phone: [phone]; Fax: [phone]  ?

## 2021-05-07 ENCOUNTER — Encounter: Payer: Self-pay | Admitting: Physician Assistant

## 2021-05-07 ENCOUNTER — Ambulatory Visit (INDEPENDENT_AMBULATORY_CARE_PROVIDER_SITE_OTHER): Payer: 59 | Admitting: Physician Assistant

## 2021-05-07 VITALS — BP 110/60 | HR 80 | Ht 67.0 in | Wt 148.2 lb

## 2021-05-07 DIAGNOSIS — E7800 Pure hypercholesterolemia, unspecified: Secondary | ICD-10-CM

## 2021-05-07 DIAGNOSIS — I10 Essential (primary) hypertension: Secondary | ICD-10-CM | POA: Diagnosis not present

## 2021-05-07 DIAGNOSIS — I359 Nonrheumatic aortic valve disorder, unspecified: Secondary | ICD-10-CM | POA: Diagnosis not present

## 2021-05-07 DIAGNOSIS — I428 Other cardiomyopathies: Secondary | ICD-10-CM | POA: Diagnosis not present

## 2021-05-07 MED ORDER — ENTRESTO 49-51 MG PO TABS: 1.0000 | ORAL_TABLET | Freq: Two times a day (BID) | ORAL | 11 refills | Status: DC

## 2021-05-07 NOTE — Assessment & Plan Note (Signed)
Blood pressure is much better controlled and is now optimal.  Continue amlodipine 10 mg daily, carvedilol 12.5 mg twice daily, Entresto 49/51 mg twice daily ?

## 2021-05-07 NOTE — Assessment & Plan Note (Signed)
Continue rosuvastatin 10 mg daily.  Here for follow-up lipids planned for later this month. ?

## 2021-05-07 NOTE — Assessment & Plan Note (Signed)
Coronary CTA with calcium score of 0 and no CAD.  She is not having symptoms of volume excess.  She only takes furosemide as needed for leg edema.  Her potassium was high normal.  Therefore, she was not placed on spironolactone.  At this point, I do not think she needs SGLT2 inhibitor.  Continue Entresto 49/51 mg twice daily, carvedilol 12.5 mg twice daily.  Obtain limited echo in 2 months.  If EF is lower, add SGLT2 inhibitor. ?

## 2021-05-07 NOTE — Assessment & Plan Note (Signed)
Most recent echocardiogram with mild to moderate AI and mean aortic valve gradient of 10.3 mmHg.  She will need a follow-up echocardiogram in 1 year. ?

## 2021-05-07 NOTE — Patient Instructions (Addendum)
Medication Instructions:  ?Your physician recommends that you continue on your current medications as directed. Please refer to the Current Medication list given to you today. ? ?*If you need a refill on your cardiac medications before your next appointment, please call your pharmacy* ? ? ?Lab Work: ?None ordered ? ?If you have labs (blood work) drawn today and your tests are completely normal, you will receive your results only by: ?MyChart Message (if you have MyChart) OR ?A paper copy in the mail ?If you have any lab test that is abnormal or we need to change your treatment, we will call you to review the results. ? ? ?Testing/Procedures: ?Your physician has requested that you have an Limited echocardiogram IN 2 MONTHS.Marland Kitchen Echocardiography is a painless test that uses sound waves to create images of your heart. It provides your doctor with information about the size and shape of your heart and how well your heart?s chambers and valves are working. This procedure takes approximately one hour. There are no restrictions for this procedure. ? ? ? ?Follow-Up: ?At [facility], you and your health needs are our priority.  As part of our continuing mission to provide you with exceptional heart care, we have created designated Provider Care Teams.  These Care Teams include your primary Cardiologist (physician) and Advanced Practice Providers (APPs -  Physician Assistants and Nurse Practitioners) who all work together to provide you with the care you need, when you need it. ? ?We recommend signing up for the patient portal called "MyChart".  Sign up information is provided on this After Visit Summary.  MyChart is used to connect with patients for Virtual Visits (Telemedicine).  Patients are able to view lab/test results, encounter notes, upcoming appointments, etc.  Non-urgent messages can be sent to your provider as well.   ?To learn more about what you can do with MyChart, go to [email or web address].   ? ?Your next  appointment:   ?6 month(s) ? ?The format for your next appointment:   ?In Person ? ?Provider:   ?Meriam Sprague, MD  or Tereso Newcomer, PA-C       ? ? ?Other Instructions ? ?

## 2021-05-25 ENCOUNTER — Other Ambulatory Visit: Payer: 59 | Admitting: *Deleted

## 2021-05-25 DIAGNOSIS — I350 Nonrheumatic aortic (valve) stenosis: Secondary | ICD-10-CM

## 2021-05-25 DIAGNOSIS — I351 Nonrheumatic aortic (valve) insufficiency: Secondary | ICD-10-CM

## 2021-05-25 DIAGNOSIS — E7800 Pure hypercholesterolemia, unspecified: Secondary | ICD-10-CM

## 2021-05-25 LAB — LIPID PANEL
Chol/HDL Ratio: 2 ratio (ref 0.0–4.4)
Cholesterol, Total: 144 mg/dL (ref 100–199)
HDL: 73 mg/dL (ref >39)
LDL Chol Calc (NIH): 61 mg/dL (ref 0–99)
Triglycerides: 44 mg/dL (ref 0–149)
VLDL Cholesterol Cal: 10 mg/dL (ref 5–40)

## 2021-05-25 LAB — HEPATIC FUNCTION PANEL
ALT: 16 IU/L (ref 0–32)
AST: 24 IU/L (ref 0–40)
Albumin: 3.9 g/dL (ref 3.8–4.9)
Alkaline Phosphatase: 100 IU/L (ref 44–121)
Bilirubin Total: 0.4 mg/dL (ref 0.0–1.2)
Bilirubin, Direct: 0.14 mg/dL (ref 0.00–0.40)
Total Protein: 7.3 g/dL (ref 6.0–8.5)

## 2021-05-26 NOTE — Progress Notes (Signed)
Pt has been made aware of normal result and verbalized understanding.  jw

## 2021-07-23 ENCOUNTER — Ambulatory Visit (HOSPITAL_COMMUNITY): Payer: 59 | Attending: Cardiology

## 2021-07-23 DIAGNOSIS — I359 Nonrheumatic aortic valve disorder, unspecified: Secondary | ICD-10-CM | POA: Diagnosis present

## 2021-07-23 DIAGNOSIS — I10 Essential (primary) hypertension: Secondary | ICD-10-CM | POA: Insufficient documentation

## 2021-07-23 DIAGNOSIS — I428 Other cardiomyopathies: Secondary | ICD-10-CM | POA: Diagnosis not present

## 2021-07-23 LAB — ECHOCARDIOGRAM LIMITED
AV Mean grad: 12 mmHg
AV Peak grad: 22.5 mmHg
Ao pk vel: 2.37 m/s
Area-P 1/2: 3.37 cm2
P 1/2 time: 560 msec
S' Lateral: 3.25 cm

## 2021-12-01 ENCOUNTER — Ambulatory Visit: Payer: 59 | Admitting: Physician Assistant

## 2022-02-16 ENCOUNTER — Other Ambulatory Visit: Payer: Self-pay

## 2022-02-16 DIAGNOSIS — I351 Nonrheumatic aortic (valve) insufficiency: Secondary | ICD-10-CM

## 2022-02-16 DIAGNOSIS — I350 Nonrheumatic aortic (valve) stenosis: Secondary | ICD-10-CM

## 2022-02-16 MED ORDER — CARVEDILOL 12.5 MG PO TABS: 12.5000 mg | ORAL_TABLET | Freq: Two times a day (BID) | ORAL | 3 refills | Status: DC

## 2022-03-28 ENCOUNTER — Other Ambulatory Visit: Payer: Self-pay

## 2022-03-28 DIAGNOSIS — E7800 Pure hypercholesterolemia, unspecified: Secondary | ICD-10-CM

## 2022-03-28 DIAGNOSIS — I351 Nonrheumatic aortic (valve) insufficiency: Secondary | ICD-10-CM

## 2022-03-28 DIAGNOSIS — I350 Nonrheumatic aortic (valve) stenosis: Secondary | ICD-10-CM

## 2022-03-28 MED ORDER — AMLODIPINE BESYLATE 10 MG PO TABS: 10.0000 mg | ORAL_TABLET | Freq: Every day | ORAL | 2 refills | Status: DC

## 2022-05-18 ENCOUNTER — Other Ambulatory Visit: Payer: Self-pay

## 2022-05-18 MED ORDER — ENTRESTO 49-51 MG PO TABS: 1.0000 | ORAL_TABLET | Freq: Two times a day (BID) | ORAL | 0 refills | Status: DC

## 2022-05-19 NOTE — Progress Notes (Signed)
Cardiology Office Note:    Date:  05/20/2022  ID:  Paschal Dopp, DOB 04/18/1965, MRN [id number] PCP: Patient, No Pcp Per  [facility] Providers Cardiologist:  Meriam Sprague, MD Cardiology APP:  Beatrice Lecher, PA-C       Patient Profile:   Aortic valve disease Aortic insufficiency and Aortic Stenosis Pt admx to [facility] in 2002 w flash pulmonary edema  Stable on ACE and Furosemide  TTE (09/28/10):  EF 55% to 60%.  TTE 01/01/2015: EF 45-50 TTE 05/04/2016: EF 60-65, mild AS(mean gradient 14 mmHg), moderate AI, trivial MR TTE 03/2021: EF 40-45, mild-mod AI, mean AV 10.3 mmHg (Vmax 219 cm/s, DI 0.39) TTE 07/2021: EF 50-55, mild MR, mild to mod AI Non-ischemic cardiomyopathy  Thought to be due to uncontrolled HTN // Improved to normal  Variable EFs in the past CCTA 3/23: CAC score 0; no CAD  PVCs Hypertension  Hyperlipidemia     History of Present Illness:   Wendy Stanley is a 57 y.o. female who returns for f/u on aortic valve disease and non-ischemic cardiomyopathy. She was last seen 05/07/21.  She is here alone. She has not had chest pain, shortness of breath, syncope, orthopnea, significant pedal edema.  She exercises on a regular basis and often gets more than 10,000 steps per day.  Review of Systems  Gastrointestinal:  Negative for hematochezia and melena.  Genitourinary:  Negative for hematuria.   see HPI    Studies Reviewed:    EKG: NSR, HR 77, left axis deviation, no ST-T wave changes, LVH, QTc 441   Risk Assessment/Calculations:     HYPERTENSION CONTROL Vitals:   05/20/22 0803 05/20/22 0835  BP: (!) 148/64 (!) 160/80    The patient's blood pressure is elevated above target today.  In order to address the patient's elevated BP: Blood pressure will be monitored at home to determine if medication changes need to be made.          Physical Exam:   VS:  BP (!) 160/80   Pulse 77   Ht  (1.702 m)   Wt 150 lb (68 kg)   LMP 02/04/2012 Comment:  pre menopausal  SpO2 97%   BMI 23.49 kg/m    Wt Readings from Last 3 Encounters:  05/20/22 150 lb (68 kg)  05/07/21 148 lb 3.2 oz (67.2 kg)  04/09/21 146 lb (66.2 kg)    Constitutional:      Appearance: Healthy appearance. Not in distress.  Neck:     Vascular: JVD normal.  Pulmonary:     Breath sounds: Normal breath sounds. No wheezing. No rales.  Cardiovascular:     Normal rate. Regular rhythm. Normal S1. Normal S2.      Murmurs: There is a grade 2/6 systolic murmur at the URSB. There is a grade 2/4 decrescendo diastolic murmur at the LLSB.  Edema:    Peripheral edema absent.  Abdominal:     Palpations: Abdomen is soft.       ASSESSMENT AND PLAN:   Essential hypertension Blood pressure is uncontrolled.  She is fasting today and has not taken any medications.  Her blood pressure is usually well-controlled at home.  I have asked her to monitor blood pressure for 2 weeks and send those readings through MyChart for review.  Continue amlodipine 10 mg daily, carvedilol 12.5 mg twice daily, Entresto 49/51 mg twice daily.  If blood pressure remains uncontrolled, consider increasing carvedilol versus Entresto.  Obtain follow-up CMET today.  Hypercholesterolemia  LDL was well-controlled at 61 in April 2023.  Continue rosuvastatin 10 mg daily.  Obtain follow-up CMET, lipids today.  Aortic valve disease Mild to moderate AI by echocardiogram in June 2023.  Arrange follow-up echocardiogram in June 2024.  Nonischemic cardiomyopathy (HCC) Cardiomyopathy seems to be related to uncontrolled blood pressure.  We discussed the importance of maintaining good blood pressure control.       Dispo:  Return in about 1 year (around 05/20/2023) for Routine Follow Up, w/ Tereso Newcomer, PA-C.  Signed, Tereso Newcomer, PA-C

## 2022-05-20 ENCOUNTER — Encounter: Payer: Self-pay | Admitting: Physician Assistant

## 2022-05-20 ENCOUNTER — Ambulatory Visit: Payer: 59 | Attending: Physician Assistant | Admitting: Physician Assistant

## 2022-05-20 VITALS — BP 160/80 | HR 77 | Ht 67.0 in | Wt 150.0 lb

## 2022-05-20 DIAGNOSIS — I350 Nonrheumatic aortic (valve) stenosis: Secondary | ICD-10-CM

## 2022-05-20 DIAGNOSIS — I359 Nonrheumatic aortic valve disorder, unspecified: Secondary | ICD-10-CM | POA: Diagnosis not present

## 2022-05-20 DIAGNOSIS — E7800 Pure hypercholesterolemia, unspecified: Secondary | ICD-10-CM | POA: Diagnosis not present

## 2022-05-20 DIAGNOSIS — I10 Essential (primary) hypertension: Secondary | ICD-10-CM

## 2022-05-20 DIAGNOSIS — I428 Other cardiomyopathies: Secondary | ICD-10-CM

## 2022-05-20 DIAGNOSIS — I351 Nonrheumatic aortic (valve) insufficiency: Secondary | ICD-10-CM | POA: Diagnosis not present

## 2022-05-20 MED ORDER — ENTRESTO 49-51 MG PO TABS: 1.0000 | ORAL_TABLET | Freq: Two times a day (BID) | ORAL | 3 refills | Status: DC

## 2022-05-20 MED ORDER — CARVEDILOL 12.5 MG PO TABS: 12.5000 mg | ORAL_TABLET | Freq: Two times a day (BID) | ORAL | 3 refills | Status: DC

## 2022-05-20 MED ORDER — ENTRESTO 49-51 MG PO TABS: 1.0000 | ORAL_TABLET | Freq: Two times a day (BID) | ORAL | 3 refills | Status: AC

## 2022-05-20 MED ORDER — AMLODIPINE BESYLATE 10 MG PO TABS: 10.0000 mg | ORAL_TABLET | Freq: Every day | ORAL | 3 refills | Status: DC

## 2022-05-20 MED ORDER — FUROSEMIDE 40 MG PO TABS: ORAL_TABLET | ORAL | 3 refills | Status: DC

## 2022-05-20 MED ORDER — ROSUVASTATIN CALCIUM 10 MG PO TABS: 10.0000 mg | ORAL_TABLET | Freq: Every day | ORAL | 3 refills | Status: DC

## 2022-05-20 NOTE — Assessment & Plan Note (Signed)
Mild to moderate AI by echocardiogram in June 2023.  Arrange follow-up echocardiogram in June 2024.

## 2022-05-20 NOTE — Patient Instructions (Signed)
Medication Instructions:   Your physician recommends that you continue on your current medications as directed. Please refer to the Current Medication list given to you today.   *If you need a refill on your cardiac medications before your next appointment, please call your pharmacy*   Lab Work:  TODAY!!!! CMET/LIPID  If you have labs (blood work) drawn today and your tests are completely normal, you will receive your results only by: MyChart Message (if you have MyChart) OR A paper copy in the mail If you have any lab test that is abnormal or we need to change your treatment, we will call you to review the results.   Testing/Procedures:  Your physician has requested that you have an echocardiogram. Echocardiography is a painless test that uses sound waves to create images of your heart. It provides your doctor with information about the size and shape of your heart and how well your heart's chambers and valves are working. This procedure takes approximately one hour. There are no restrictions for this procedure. Please do NOT wear cologne, perfume or lotions (deodorant is allowed). Please arrive 15 minutes prior to your appointment time.    Follow-Up: At [facility], you and your health needs are our priority.  As part of our continuing mission to provide you with exceptional heart care, we have created designated Provider Care Teams.  These Care Teams include your primary Cardiologist (physician) and Advanced Practice Providers (APPs -  Physician Assistants and Nurse Practitioners) who all work together to provide you with the care you need, when you need it.  We recommend signing up for the patient portal called "MyChart".  Sign up information is provided on this After Visit Summary.  MyChart is used to connect with patients for Virtual Visits (Telemedicine).  Patients are able to view lab/test results, encounter notes, upcoming appointments, etc.  Non-urgent messages can be  sent to your provider as well.   To learn more about what you can do with MyChart, go to [email or web address].    Your next appointment:   1 year(s)  Provider:   Tereso Newcomer, PA-C         Other Instructions  Your physician wants you to follow-up in: 1 year with Tereso Newcomer, PA-C. You will receive a reminder letter in the mail two months in advance. If you don't receive a letter, please call our office to schedule the follow-up appointment.  Please check BP and record X 2 weeks and than send in reading through mychart.

## 2022-05-20 NOTE — Assessment & Plan Note (Addendum)
Blood pressure is uncontrolled.  She is fasting today and has not taken any medications.  Her blood pressure is usually well-controlled at home.  I have asked her to monitor blood pressure for 2 weeks and send those readings through MyChart for review.  Continue amlodipine 10 mg daily, carvedilol 12.5 mg twice daily, Entresto 49/51 mg twice daily.  If blood pressure remains uncontrolled, consider increasing carvedilol versus Entresto.  Obtain follow-up CMET today.

## 2022-05-20 NOTE — Assessment & Plan Note (Signed)
LDL was well-controlled at 61 in April 2023.  Continue rosuvastatin 10 mg daily.  Obtain follow-up CMET, lipids today.

## 2022-05-20 NOTE — Assessment & Plan Note (Signed)
Cardiomyopathy seems to be related to uncontrolled blood pressure.  We discussed the importance of maintaining good blood pressure control.

## 2022-05-21 LAB — COMPREHENSIVE METABOLIC PANEL
ALT: 13 IU/L (ref 0–32)
AST: 20 IU/L (ref 0–40)
Albumin/Globulin Ratio: 1.1 — ABNORMAL LOW (ref 1.2–2.2)
Albumin: 3.8 g/dL (ref 3.8–4.9)
Alkaline Phosphatase: 107 IU/L (ref 44–121)
BUN/Creatinine Ratio: 22 (ref 9–23)
BUN: 22 mg/dL (ref 6–24)
Bilirubin Total: 0.3 mg/dL (ref 0.0–1.2)
CO2: 24 mmol/L (ref 20–29)
Calcium: 9.2 mg/dL (ref 8.7–10.2)
Chloride: 109 mmol/L — ABNORMAL HIGH (ref 96–106)
Creatinine, Ser: 1 mg/dL (ref 0.57–1.00)
Globulin, Total: 3.4 g/dL (ref 1.5–4.5)
Glucose: 101 mg/dL — ABNORMAL HIGH (ref 70–99)
Potassium: 4.9 mmol/L (ref 3.5–5.2)
Sodium: 145 mmol/L — ABNORMAL HIGH (ref 134–144)
Total Protein: 7.2 g/dL (ref 6.0–8.5)
eGFR: 65 mL/min/{1.73_m2} (ref >59)

## 2022-05-21 LAB — LIPID PANEL
Chol/HDL Ratio: 2.8 ratio (ref 0.0–4.4)
Cholesterol, Total: 182 mg/dL (ref 100–199)
HDL: 66 mg/dL (ref >39)
LDL Chol Calc (NIH): 105 mg/dL — ABNORMAL HIGH (ref 0–99)
Triglycerides: 55 mg/dL (ref 0–149)
VLDL Cholesterol Cal: 11 mg/dL (ref 5–40)

## 2022-05-23 ENCOUNTER — Telehealth: Payer: Self-pay

## 2022-05-23 NOTE — Telephone Encounter (Signed)
Patient states she saw message on my chart. She is aware of results and verbalized understanding.

## 2022-05-23 NOTE — Telephone Encounter (Signed)
-----   Message from Beatrice Lecher, [place] sent at 05/22/2022  5:14 PM EDT ----- See MyChart comments below. PLAN:  -Repeat fasting Lipids in 6 mos  [name]  Your creatinine (kidney function), potassium, liver enzymes (AST, ALT) are normal. Your LDL cholesterol is increased compared to last year. Let's continue the same dose of Rosuvastatin and recheck your labs in 6 mos. Our office will arrange this.  Tereso Newcomer, PA-C    05/22/2022 5:13 PM

## 2022-07-06 ENCOUNTER — Ambulatory Visit (HOSPITAL_COMMUNITY): Payer: 59

## 2022-08-11 ENCOUNTER — Ambulatory Visit (HOSPITAL_COMMUNITY): Payer: 59 | Attending: Cardiology

## 2022-08-11 DIAGNOSIS — I359 Nonrheumatic aortic valve disorder, unspecified: Secondary | ICD-10-CM | POA: Diagnosis present

## 2022-08-11 DIAGNOSIS — I351 Nonrheumatic aortic (valve) insufficiency: Secondary | ICD-10-CM | POA: Insufficient documentation

## 2022-08-11 DIAGNOSIS — E7800 Pure hypercholesterolemia, unspecified: Secondary | ICD-10-CM | POA: Diagnosis not present

## 2022-08-11 DIAGNOSIS — I350 Nonrheumatic aortic (valve) stenosis: Secondary | ICD-10-CM | POA: Insufficient documentation

## 2022-08-11 DIAGNOSIS — I428 Other cardiomyopathies: Secondary | ICD-10-CM | POA: Insufficient documentation

## 2022-08-11 DIAGNOSIS — I10 Essential (primary) hypertension: Secondary | ICD-10-CM | POA: Insufficient documentation

## 2022-08-11 LAB — ECHOCARDIOGRAM COMPLETE
AR max vel: 1.25 cm2
AV Area VTI: 1.27 cm2
AV Area mean vel: 1.25 cm2
AV Mean grad: 12 mmHg
AV Peak grad: 21.9 mmHg
Ao pk vel: 2.34 m/s
Area-P 1/2: 3.23 cm2
P 1/2 time: 500 msec
S' Lateral: 3.1 cm

## 2022-09-06 ENCOUNTER — Ambulatory Visit: Payer: 59 | Admitting: Physician Assistant

## 2022-09-06 ENCOUNTER — Encounter: Payer: Self-pay | Admitting: Physician Assistant

## 2022-09-06 VITALS — BP 168/84 | HR 90 | Ht 67.0 in | Wt 150.8 lb

## 2022-09-06 DIAGNOSIS — I359 Nonrheumatic aortic valve disorder, unspecified: Secondary | ICD-10-CM

## 2022-09-06 DIAGNOSIS — I428 Other cardiomyopathies: Secondary | ICD-10-CM | POA: Diagnosis not present

## 2022-09-06 DIAGNOSIS — I10 Essential (primary) hypertension: Secondary | ICD-10-CM | POA: Diagnosis not present

## 2022-09-06 NOTE — Assessment & Plan Note (Signed)
Recent echocardiogram with moderate to severe AI.  We discussed the rationale for proceeding with transesophageal echocardiogram and what would need to be done next if she truly has severe AI.  Fortunately, she is asymptomatic.  I am unable to appreciate a murmur on exam.  Risks and benefits of the procedure have been discussed.  She is not ready to schedule yet.  She will have to check her calendar and call us back.

## 2022-09-06 NOTE — H&P (View-Only) (Signed)
Cardiology Office Note:    Date:  09/06/2022  ID:  Wendy Stanley, DOB 10-Nov-1965, MRN [id number] PCP: Patient, No Pcp Per  [facility] Providers Cardiologist:  Meriam Sprague, MD Cardiology APP:  Beatrice Lecher, PA-C       Patient Profile:      Aortic valve disease Aortic insufficiency and Aortic Stenosis Pt admx to [facility] in 2002 w flash pulmonary edema  Stable on ACE and Furosemide  TTE (09/28/10):  EF 55% to 60%.  TTE 01/01/2015: EF 45-50 TTE 05/04/2016: EF 60-65, mild AS(mean gradient 14 mmHg), moderate AI, trivial MR TTE 03/2021: EF 40-45, mild-mod AI, mean AV 10.3 mmHg (Vmax 219 cm/s, DI 0.39) TTE 07/2021: EF 50-55, mild MR, mild to mod AI TTE 08/11/2022: EF 55-60, moderate LVH, G1 DD, normal RV SF, mild MR, suspected bicuspid aortic valve, moderate to severe AI, RAP 8 Non-ischemic cardiomyopathy  Thought to be due to uncontrolled HTN // Improved to normal  Variable EFs in the past CCTA 3/23: CAC score 0; no CAD  PVCs Hypertension  Hyperlipidemia          History of present illness  Wendy Stanley is a 57 y.o. female who returns for follow-up recent echocardiogram demonstrated moderate to severe AI.  I reviewed her echo with Dr. Shari Prows.  Recommendation was to proceed with TEE.  She returns to discuss the procedure.  She is here alone.  She has not had any chest pain, shortness of breath, syncope.  She has not had orthopnea or lower extremity edema.  She remains active and exercises.  Blood pressures at home are optimal.  ROS: See HPI No melena, hematochezia.    Studies Reviewed:   EKG Interpretation Date/Time:  Tuesday September 06 2022 14:27:37 EDT Ventricular Rate:  87 PR Interval:  156 QRS Duration:  86 QT Interval:  362 QTC Calculation: 435 R Axis:   -1  Text Interpretation: Normal sinus rhythm Minimal voltage criteria for LVH, may be normal variant ( R in aVL ) Nonspecific ST and T wave abnormality Confirmed by Tereso Newcomer [phone]) on 09/06/2022  2:31:03 PM    Risk Assessment/Calculations:     HYPERTENSION CONTROL Vitals:   09/06/22 1343 09/06/22 1419  BP: (!) 161/78 (!) 168/84    The patient's blood pressure is elevated above target today.  In order to address the patient's elevated BP:           Physical Exam:   VS:  BP (!) 168/84   Pulse 90   Ht 5\' 7"  (1.702 m)   Wt 150 lb 12.8 oz (68.4 kg)   LMP 02/04/2012 Comment: pre menopausal  SpO2 96%   BMI 23.62 kg/m    Wt Readings from Last 3 Encounters:  09/06/22 150 lb 12.8 oz (68.4 kg)  05/20/22 150 lb (68 kg)  05/07/21 148 lb 3.2 oz (67.2 kg)    GEN: Well nourished, well developed in no acute distress NECK: No JVD CARDIAC: RRR, I cannot appreciate a murmur, no rubs, gallops RESPIRATORY:  Clear to auscultation without rales, wheezing or rhonchi  ABDOMEN: Soft EXTREMITIES:  No edema     Assessment and Plan:  Aortic valve disease Recent echocardiogram with moderate to severe AI.  We discussed the rationale for proceeding with transesophageal echocardiogram and what would need to be done next if she truly has severe AI.  Fortunately, she is asymptomatic.  I am unable to appreciate a murmur on exam.  Risks and benefits of the procedure have been  discussed.  She is not ready to schedule yet.  She will have to check her calendar and call us back.  Essential hypertension Blood pressure is optimal at home.  She tends to have high blood pressures in the office.  Continue carvedilol 12.5 mg twice daily, Entresto 49/51 mg twice daily, amlodipine 10 mg daily.  Continue to monitor blood pressure at home.  Nonischemic cardiomyopathy (HCC) EF was as low as 45 in the past.  Most recent echocardiogram with normal EF.  Cardiomyopathy was felt to be due to uncontrolled hypertension in the past.  Continue carvedilol 12.5 mg twice daily, Entresto 49/51 mg twice daily.    Addendum: Pt called back to schedule. TEE will be scheduled for 09/14/22 with Dr. Jens Som.   Informed Consent    Shared Decision Making/Informed Consent The risks [esophageal damage, perforation (1:10,000 risk), bleeding, pharyngeal hematoma as well as other potential complications associated with conscious sedation including aspiration, arrhythmia, respiratory failure and death], benefits (treatment guidance and diagnostic support) and alternatives of a transesophageal echocardiogram were discussed in detail with Wendy Stanley and she is willing to proceed.      Dispo:  Return in about 6 months (around 03/09/2023) for Routine Follow Up, w/ Tereso Newcomer, PA-C.  Signed, Tereso Newcomer, PA-C

## 2022-09-06 NOTE — Patient Instructions (Addendum)
Medication Instructions:  Your physician recommends that you continue on your current medications as directed. Please refer to the Current Medication list given to you today.  *If you need a refill on your cardiac medications before your next appointment, please call your pharmacy*  Lab Work: None ordered today.  Testing/Procedures: Your physician has requested that you have a TEE. During a TEE, sound waves are used to create images of your heart. It provides your doctor with information about the size and shape of your heart and how well your heart's chambers and valves are working. In this test, a transducer is attached to the end of a flexible tube that's guided down your throat and into your esophagus (the tube leading from you mouth to your stomach) to get a more detailed image of your heart. You are not awake for the procedure. Please see the instruction sheet given to you today. For further information please visit [email or web address]   Please call us to schedule a date for the TEE.  Follow-Up: At [facility], you and your health needs are our priority.  As part of our continuing mission to provide you with exceptional heart care, we have created designated Provider Care Teams.  These Care Teams include your primary Cardiologist (physician) and Advanced Practice Providers (APPs -  Physician Assistants and Nurse Practitioners) who all work together to provide you with the care you need, when you need it.  Your next appointment:   6 month(s)  The format for your next appointment:   In Person  Provider:   Tereso Newcomer, PA-C  Other Instructions   Please call our office at [phone] to schedule a date and time for your TEE. This will be performed at [facility]. Below are instructions for this procedure, you will receive a more detailed instruction letter once a date and time have been scheduled.  DIET:  Nothing to eat or drink after midnight except a sip of water with  medications (see medication instructions below)  MEDICATION INSTRUCTIONS: !!IF ANY NEW MEDICATIONS ARE STARTED AFTER TODAY, PLEASE NOTIFY YOUR PROVIDER AS SOON AS POSSIBLE!!  FYI: Medications such as Semaglutide (Ozempic, [place]), Tirzepatide (Mounjaro, Zepbound), Dulaglutide (Trulicity), etc ("GLP1 agonists") AND Canagliflozin (Invokana), Dapagliflozin (Farxiga), Empagliflozin (Jardiance), Ertugliflozin (Steglatro), Bexagliflozin [employer]) or any combination with one of these drugs such as Invokamet (Canagliflozin/Metformin), Synjardy (Empagliflozin/Metformin), etc ("SGLT2 inhibitors") must be held around the time of a procedure. This is not a comprehensive list of all of these drugs. Please review all of your medications and talk to your provider if you take any one of these. If you are not sure, ask your provider.   LABS:   CBC, BMET (this will be drawn the same day as your TEE).  FYI:  For your safety, and to allow [place] to monitor your vital signs accurately during the surgery/procedure we request: If you have artificial nails, gel coating, SNS etc, please have those removed prior to your surgery/procedure. Not having the nail coverings /polish removed may result in cancellation or delay of your surgery/procedure.  You must have a responsible person to drive you home and stay in the waiting area during your procedure. Failure to do so could result in cancellation.  Bring your insurance cards.  *Special Note: Every effort is made to have your procedure done on time. Occasionally there are emergencies that occur at the hospital that may cause delays. Please be patient if a delay does occur.

## 2022-09-06 NOTE — Assessment & Plan Note (Signed)
Blood pressure is optimal at home.  She tends to have high blood pressures in the office.  Continue carvedilol 12.5 mg twice daily, Entresto 49/51 mg twice daily, amlodipine 10 mg daily.  Continue to monitor blood pressure at home.

## 2022-09-06 NOTE — Progress Notes (Addendum)
Cardiology Office Note:    Date:  09/06/2022  ID:  Erabella Seldon, DOB 10-Nov-1965, MRN [id number] PCP: Patient, No Pcp Per  [facility] Providers Cardiologist:  Meriam Sprague, MD Cardiology APP:  Beatrice Lecher, PA-C       Patient Profile:      Aortic valve disease Aortic insufficiency and Aortic Stenosis Pt admx to [facility] in 2002 w flash pulmonary edema  Stable on ACE and Furosemide  TTE (09/28/10):  EF 55% to 60%.  TTE 01/01/2015: EF 45-50 TTE 05/04/2016: EF 60-65, mild AS(mean gradient 14 mmHg), moderate AI, trivial MR TTE 03/2021: EF 40-45, mild-mod AI, mean AV 10.3 mmHg (Vmax 219 cm/s, DI 0.39) TTE 07/2021: EF 50-55, mild MR, mild to mod AI TTE 08/11/2022: EF 55-60, moderate LVH, G1 DD, normal RV SF, mild MR, suspected bicuspid aortic valve, moderate to severe AI, RAP 8 Non-ischemic cardiomyopathy  Thought to be due to uncontrolled HTN // Improved to normal  Variable EFs in the past CCTA 3/23: CAC score 0; no CAD  PVCs Hypertension  Hyperlipidemia          History of present illness  Wendy Stanley is a 57 y.o. female who returns for follow-up recent echocardiogram demonstrated moderate to severe AI.  I reviewed her echo with Dr. Shari Prows.  Recommendation was to proceed with TEE.  She returns to discuss the procedure.  She is here alone.  She has not had any chest pain, shortness of breath, syncope.  She has not had orthopnea or lower extremity edema.  She remains active and exercises.  Blood pressures at home are optimal.  ROS: See HPI No melena, hematochezia.    Studies Reviewed:   EKG Interpretation Date/Time:  Tuesday September 06 2022 14:27:37 EDT Ventricular Rate:  87 PR Interval:  156 QRS Duration:  86 QT Interval:  362 QTC Calculation: 435 R Axis:   -1  Text Interpretation: Normal sinus rhythm Minimal voltage criteria for LVH, may be normal variant ( R in aVL ) Nonspecific ST and T wave abnormality Confirmed by Tereso Newcomer [phone]) on 09/06/2022  2:31:03 PM    Risk Assessment/Calculations:     HYPERTENSION CONTROL Vitals:   09/06/22 1343 09/06/22 1419  BP: (!) 161/78 (!) 168/84    The patient's blood pressure is elevated above target today.  In order to address the patient's elevated BP:           Physical Exam:   VS:  BP (!) 168/84   Pulse 90   Ht 5\' 7"  (1.702 m)   Wt 150 lb 12.8 oz (68.4 kg)   LMP 02/04/2012 Comment: pre menopausal  SpO2 96%   BMI 23.62 kg/m    Wt Readings from Last 3 Encounters:  09/06/22 150 lb 12.8 oz (68.4 kg)  05/20/22 150 lb (68 kg)  05/07/21 148 lb 3.2 oz (67.2 kg)    GEN: Well nourished, well developed in no acute distress NECK: No JVD CARDIAC: RRR, I cannot appreciate a murmur, no rubs, gallops RESPIRATORY:  Clear to auscultation without rales, wheezing or rhonchi  ABDOMEN: Soft EXTREMITIES:  No edema     Assessment and Plan:  Aortic valve disease Recent echocardiogram with moderate to severe AI.  We discussed the rationale for proceeding with transesophageal echocardiogram and what would need to be done next if she truly has severe AI.  Fortunately, she is asymptomatic.  I am unable to appreciate a murmur on exam.  Risks and benefits of the procedure have been  discussed.  She is not ready to schedule yet.  She will have to check her calendar and call us back.  Essential hypertension Blood pressure is optimal at home.  She tends to have high blood pressures in the office.  Continue carvedilol 12.5 mg twice daily, Entresto 49/51 mg twice daily, amlodipine 10 mg daily.  Continue to monitor blood pressure at home.  Nonischemic cardiomyopathy (HCC) EF was as low as 45 in the past.  Most recent echocardiogram with normal EF.  Cardiomyopathy was felt to be due to uncontrolled hypertension in the past.  Continue carvedilol 12.5 mg twice daily, Entresto 49/51 mg twice daily.    Addendum: Pt called back to schedule. TEE will be scheduled for 09/14/22 with Dr. Jens Som.   Informed Consent    Shared Decision Making/Informed Consent The risks [esophageal damage, perforation (1:10,000 risk), bleeding, pharyngeal hematoma as well as other potential complications associated with conscious sedation including aspiration, arrhythmia, respiratory failure and death], benefits (treatment guidance and diagnostic support) and alternatives of a transesophageal echocardiogram were discussed in detail with [name] and she is willing to proceed.      Dispo:  Return in about 6 months (around 03/09/2023) for Routine Follow Up, w/ Tereso Newcomer, PA-C.  Signed, Tereso Newcomer, PA-C

## 2022-09-06 NOTE — Assessment & Plan Note (Signed)
EF was as low as 45 in the past.  Most recent echocardiogram with normal EF.  Cardiomyopathy was felt to be due to uncontrolled hypertension in the past.  Continue carvedilol 12.5 mg twice daily, Entresto 49/51 mg twice daily.

## 2022-09-08 ENCOUNTER — Telehealth: Payer: Self-pay | Admitting: Physician Assistant

## 2022-09-08 DIAGNOSIS — I359 Nonrheumatic aortic valve disorder, unspecified: Secondary | ICD-10-CM

## 2022-09-08 NOTE — Telephone Encounter (Signed)
Patient called in stating Victorino Dike advised her to call the office to schedule the TEE Tereso Newcomer advised her to have at her last appt. Advised patient this is scheduled and performed by the hospital, so we are unable to schedule. There is not currently an active order in the system for the TEE for the patient to be contacted to arrange.   Please advise.

## 2022-09-08 NOTE — Telephone Encounter (Signed)
Per last visit with Wendy Stanley a TEE should have been ordered. Placed order and scheduled on 09/14/22 at 1:30. Reviewed with patient and sent letter in mail. No questions at this time

## 2022-09-09 NOTE — Addendum Note (Signed)
Addended byAlben Spittle, Lorin Picket T on: 09/09/2022 12:58 PM   Modules accepted: Orders

## 2022-09-13 NOTE — Progress Notes (Signed)
Attempted to call and give pt pre-procedure instructions, however pt did not answer and was unable to leave a voicemail dt inbox being full. Called sister who is other contact, but no answer.

## 2022-09-14 ENCOUNTER — Other Ambulatory Visit: Payer: Self-pay

## 2022-09-14 ENCOUNTER — Ambulatory Visit (HOSPITAL_BASED_OUTPATIENT_CLINIC_OR_DEPARTMENT_OTHER)
Admission: RE | Admit: 2022-09-14 | Discharge: 2022-09-14 | Disposition: A | Discharge status: Home / Self Care | Payer: 59 | Source: Ambulatory Visit | Attending: Physician Assistant | Admitting: Physician Assistant

## 2022-09-14 ENCOUNTER — Encounter (HOSPITAL_COMMUNITY)
Admission: RE | Disposition: A | Discharge status: Home / Self Care | Payer: Self-pay | Source: Ambulatory Visit | Attending: Cardiology

## 2022-09-14 ENCOUNTER — Ambulatory Visit (HOSPITAL_COMMUNITY)
Admission: RE | Admit: 2022-09-14 | Discharge: 2022-09-14 | Disposition: A | Discharge status: Home / Self Care | Payer: 59 | Source: Ambulatory Visit | Attending: Cardiology | Admitting: Cardiology

## 2022-09-14 ENCOUNTER — Encounter (HOSPITAL_COMMUNITY): Payer: Self-pay | Admitting: Cardiology

## 2022-09-14 ENCOUNTER — Ambulatory Visit (HOSPITAL_COMMUNITY): Payer: 59 | Admitting: Registered Nurse

## 2022-09-14 ENCOUNTER — Ambulatory Visit (HOSPITAL_BASED_OUTPATIENT_CLINIC_OR_DEPARTMENT_OTHER): Payer: 59 | Admitting: Registered Nurse

## 2022-09-14 DIAGNOSIS — E7800 Pure hypercholesterolemia, unspecified: Secondary | ICD-10-CM

## 2022-09-14 DIAGNOSIS — I10 Essential (primary) hypertension: Secondary | ICD-10-CM

## 2022-09-14 DIAGNOSIS — I359 Nonrheumatic aortic valve disorder, unspecified: Secondary | ICD-10-CM

## 2022-09-14 DIAGNOSIS — Z79899 Other long term (current) drug therapy: Secondary | ICD-10-CM | POA: Diagnosis not present

## 2022-09-14 DIAGNOSIS — I088 Other rheumatic multiple valve diseases: Secondary | ICD-10-CM | POA: Insufficient documentation

## 2022-09-14 DIAGNOSIS — I351 Nonrheumatic aortic (valve) insufficiency: Secondary | ICD-10-CM | POA: Diagnosis not present

## 2022-09-14 DIAGNOSIS — I428 Other cardiomyopathies: Secondary | ICD-10-CM | POA: Diagnosis not present

## 2022-09-14 HISTORY — PX: TEE WITHOUT CARDIOVERSION: SHX5443

## 2022-09-14 LAB — ECHO TEE: P 1/2 time: 209 msec

## 2022-09-14 SURGERY — ECHOCARDIOGRAM, TRANSESOPHAGEAL
Anesthesia: Monitor Anesthesia Care

## 2022-09-14 MED ORDER — PROPOFOL 10 MG/ML IV BOLUS
INTRAVENOUS | Status: DC | PRN
  Administered 2022-09-14: 50 mg via INTRAVENOUS
  Administered 2022-09-14: 100 ug/kg/min via INTRAVENOUS

## 2022-09-14 MED ORDER — SODIUM CHLORIDE 0.9 % IV SOLN: INTRAVENOUS | Status: DC

## 2022-09-14 MED ORDER — PHENYLEPHRINE HCL (PRESSORS) 10 MG/ML IV SOLN
INTRAVENOUS | Status: DC | PRN
  Administered 2022-09-14: 80 ug via INTRAVENOUS

## 2022-09-14 MED ORDER — LIDOCAINE 2% (20 MG/ML) 5 ML SYRINGE
INTRAMUSCULAR | Status: DC | PRN
  Administered 2022-09-14: 100 mg via INTRAVENOUS

## 2022-09-14 NOTE — Transfer of Care (Signed)
Immediate Anesthesia Transfer of Care Note  Patient: Wendy Stanley  Procedure(s) Performed: TRANSESOPHAGEAL ECHOCARDIOGRAM  Patient Location: PACU  Anesthesia Type:MAC  Level of Consciousness: awake, alert , and oriented  Airway & Oxygen Therapy: Patient Spontanous Breathing  Post-op Assessment: Report given to RN, Post -op Vital signs reviewed and stable, and Patient moving all extremities X 4  Post vital signs: Reviewed and stable  Last Vitals:  Vitals Value Taken Time  BP 136/71   Temp    Pulse 82   Resp    SpO2 99     Last Pain:  Vitals:   09/14/22 1236  TempSrc: Temporal  PainSc: 0-No pain         Complications: There were no known notable events for this encounter.

## 2022-09-14 NOTE — Progress Notes (Signed)
  Echocardiogram Echocardiogram Transesophageal has been performed.  Janalyn Harder 09/14/2022, 3:09 PM

## 2022-09-14 NOTE — Anesthesia Postprocedure Evaluation (Signed)
Anesthesia Post Note  Patient: Wendy Stanley  Procedure(s) Performed: TRANSESOPHAGEAL ECHOCARDIOGRAM     Patient location during evaluation: PACU Anesthesia Type: MAC Level of consciousness: awake and alert Pain management: pain level controlled Vital Signs Assessment: post-procedure vital signs reviewed and stable Respiratory status: spontaneous breathing, nonlabored ventilation, respiratory function stable and patient connected to nasal cannula oxygen Cardiovascular status: stable and blood pressure returned to baseline Postop Assessment: no apparent nausea or vomiting Anesthetic complications: no   There were no known notable events for this encounter.  Last Vitals:  Vitals:   09/14/22 1455 09/14/22 1515  BP: 126/80 (!) 140/68  Pulse: 75 74  Resp: 19   Temp:    SpO2: 100% 100%    Last Pain:  Vitals:   09/14/22 1455  TempSrc:   PainSc: 0-No pain                 Mariann Barter

## 2022-09-14 NOTE — CV Procedure (Signed)
    TRANSESOPHAGEAL ECHOCARDIOGRAM   NAME:  Wendy Stanley    MRN: [id number] DOB:  [birth year]    ADMIT DATE: 09/14/2022  INDICATIONS: Aortic regurgitation   PROCEDURE:   Informed consent was obtained prior to the procedure. The risks, benefits and alternatives for the procedure were discussed and the patient comprehended these risks.  Risks include, but are not limited to, cough, sore throat, vomiting, nausea, somnolence, esophageal and stomach trauma or perforation, bleeding, low blood pressure, aspiration, pneumonia, infection, trauma to the teeth and death.    Procedural time out performed. The oropharynx was anesthetized with viscous lidocaine.  Anesthesia was administered by the anaesthesilogy team to achieve and maintain moderate to deep conscious sedation.  The patient's heart rate, blood pressure, and oxygen saturation were monitored continuously during the procedure.  The transesophageal probe was inserted in the esophagus and stomach without difficulty and multiple views were obtained.   The patient tolerated the procedure well.  COMPLICATIONS:    There were no immediate complications.  KEY FINDINGS:  Normal EF. Aortic regurgitation is at moderate to severe. Tricuspid aortic valve. Mild mitral regurgitation, tricuspid regurgitation.  Full report to follow. Further management per primary team.   Thomasene Ripple, DO [facility]  [facility]  3:01 PM

## 2022-09-14 NOTE — Anesthesia Preprocedure Evaluation (Signed)
Anesthesia Evaluation  Patient identified by MRN, date of birth, ID band Patient awake    Reviewed: Allergy & Precautions, NPO status , Patient's Chart, lab work & pertinent test results, reviewed documented beta blocker date and time   History of Anesthesia Complications Negative for: history of anesthetic complications  Airway Mallampati: II  TM Distance: >3 FB Neck ROM: Full    Dental no notable dental hx.    Pulmonary neg COPD, neg PE   breath sounds clear to auscultation       Cardiovascular hypertension, (-) Past MI + Valvular Problems/Murmurs AI  Rhythm:Regular Rate:Normal + Diastolic murmurs Mod-severe AI   Neuro/Psych neg Headaches, neg Seizures    GI/Hepatic ,neg GERD  ,,(+) neg Cirrhosis        Endo/Other    Renal/GU Renal disease     Musculoskeletal   Abdominal   Peds  Hematology   Anesthesia Other Findings   Reproductive/Obstetrics                              Anesthesia Physical Anesthesia Plan  ASA: 3  Anesthesia Plan: MAC   Post-op Pain Management:    Induction:   PONV Risk Score and Plan: 1 and Ondansetron and Propofol infusion  Airway Management Planned:   Additional Equipment:   Intra-op Plan:   Post-operative Plan:   Informed Consent:      Dental advisory given  Plan Discussed with: CRNA  Anesthesia Plan Comments:          Anesthesia Quick Evaluation

## 2022-09-14 NOTE — Discharge Instructions (Signed)

## 2022-09-14 NOTE — Interval H&P Note (Signed)
History and Physical Interval Note:  09/14/2022 12:57 PM  Wendy Stanley  has presented today for surgery, with the diagnosis of AORTIC VALVE DISEASE.  The various methods of treatment have been discussed with the patient and family. After consideration of risks, benefits and other options for treatment, the patient has consented to  Procedure(s): TRANSESOPHAGEAL ECHOCARDIOGRAM (N/A) as a surgical intervention.  The patient's history has been reviewed, patient examined, no change in status, stable for surgery.  I have reviewed the patient's chart and labs.  Questions were answered to the patient's satisfaction.     Harinder Romas

## 2022-09-15 ENCOUNTER — Encounter (HOSPITAL_COMMUNITY): Payer: Self-pay | Admitting: Cardiology

## 2022-09-19 ENCOUNTER — Encounter: Payer: Self-pay | Admitting: *Deleted

## 2022-09-19 ENCOUNTER — Other Ambulatory Visit: Payer: Self-pay | Admitting: Physician Assistant

## 2022-09-19 ENCOUNTER — Telehealth: Payer: Self-pay | Admitting: *Deleted

## 2022-09-19 DIAGNOSIS — I359 Nonrheumatic aortic valve disorder, unspecified: Secondary | ICD-10-CM

## 2022-09-19 DIAGNOSIS — I350 Nonrheumatic aortic (valve) stenosis: Secondary | ICD-10-CM

## 2022-09-19 NOTE — Telephone Encounter (Signed)
-----   Message from Tereso Newcomer sent at 09/18/2022  9:34 PM EDT ----- See comments to patient sent via MyChart (see below). PLAN:  -Arrange ETT (Dx: aortic insufficiency)  Wendy Stanley  I reviewed your Transesophageal Echocardiogram with one of our structural cardiologists. Your valve is leaking to a moderate to severe degree. However, the dimensions of your heart are normal. This means that the leakage has not caused your heart to dilate, which is reassuring. As long as you are not symptomatic, we will just need to continue periodic echocardiograms to keep an eye on this. In order to objectively understand your degree of symptoms, if any, we need to do a stress test. This will help [place] better understand if the valve leakage is affecting you. The stress test is just done on a treadmill. The incline starts at 10%. The incline and speed increases every 3 minutes. We monitor your EKG the whole time. We will try to get to your target heart rate or stop earlier if you cannot keep going. Overall, it is a very safe test. I will have the CMA contact you to arrange this. Let me know if you have any questions. Tereso Newcomer, PA-C    09/18/2022 9:32 PM

## 2022-10-07 ENCOUNTER — Encounter: Payer: Self-pay | Admitting: *Deleted

## 2022-10-10 ENCOUNTER — Encounter: Payer: Self-pay | Admitting: Physician Assistant

## 2023-02-22 ENCOUNTER — Other Ambulatory Visit: Payer: Self-pay | Admitting: Physician Assistant

## 2023-04-04 ENCOUNTER — Encounter: Payer: Self-pay | Admitting: Cardiology

## 2023-04-04 ENCOUNTER — Ambulatory Visit: Payer: 59 | Attending: Cardiology | Admitting: Cardiology

## 2023-04-04 VITALS — BP 172/90 | HR 77 | Ht 67.0 in | Wt 162.0 lb

## 2023-04-04 DIAGNOSIS — I10 Essential (primary) hypertension: Secondary | ICD-10-CM

## 2023-04-04 DIAGNOSIS — I359 Nonrheumatic aortic valve disorder, unspecified: Secondary | ICD-10-CM

## 2023-04-04 DIAGNOSIS — I428 Other cardiomyopathies: Secondary | ICD-10-CM

## 2023-04-04 DIAGNOSIS — I517 Cardiomegaly: Secondary | ICD-10-CM

## 2023-04-04 LAB — COMPREHENSIVE METABOLIC PANEL
ALT: 15 IU/L (ref 0–32)
AST: 21 IU/L (ref 0–40)
Albumin: 4.1 g/dL (ref 3.8–4.9)
Alkaline Phosphatase: 102 IU/L (ref 44–121)
BUN/Creatinine Ratio: 20 (ref 9–23)
BUN: 21 mg/dL (ref 6–24)
Bilirubin Total: 0.3 mg/dL (ref 0.0–1.2)
CO2: 24 mmol/L (ref 20–29)
Calcium: 9.5 mg/dL (ref 8.7–10.2)
Chloride: 103 mmol/L (ref 96–106)
Creatinine, Ser: 1.06 mg/dL — ABNORMAL HIGH (ref 0.57–1.00)
Globulin, Total: 3.8 g/dL (ref 1.5–4.5)
Glucose: 93 mg/dL (ref 70–99)
Potassium: 5 mmol/L (ref 3.5–5.2)
Sodium: 142 mmol/L (ref 134–144)
Total Protein: 7.9 g/dL (ref 6.0–8.5)
eGFR: 61 mL/min/{1.73_m2} (ref >59)

## 2023-04-04 LAB — MAGNESIUM: Magnesium: 2.1 mg/dL (ref 1.6–2.3)

## 2023-04-04 NOTE — Progress Notes (Signed)
 Cardiology Office Note:    Date:  04/04/2023   ID:  Wendy Stanley, DOB Oct 24, 1965, MRN [id number]  PCP:  Patient, No Pcp Per  Cardiologist:  Thomasene Ripple, DO  Electrophysiologist:  None   Referring MD: No ref. provider found     History of Present Illness:    Wendy Stanley is a 58 y.o. female with a hx of hypertension, moderate to severe aortic regurgitation, frequent PVCs, mild aortic stenosis, hypercholesteremia, nonischemic cardiomyopathy.  She previously followed with Dr. Lorne Skeens in in the meantime has seen some of our APP's pharmacist.  She recently had a TEE August 2024 at that time discussion to monitor her AI giving normal end-diastolic diameters.  She is here today for a visit.   She reports that  She was recently startled by a bat or bird flying out of the woods while getting gas, and fell onto her hand and knee. She initially couldn't make a fist due to the pain, but has since regained this ability. She denies shortness of breath.  The patient is currently on amlodipine, Afragadol, and Entresto and did not take her medications on the day of the appointment due to uncertainty about potential labs.  The patient also mentions plans to do yard work, but is deterred by the cold weather.    Past Medical History:  Diagnosis Date   Aortic insufficiency    Aortic valve regurgitation 10/13/2010   Flash pulmonary edema (HCC) 2012   Frequent PVCs    Hypercholesterolemia 12/27/2011   Mild aortic stenosis    Nonischemic cardiomyopathy (HCC) 02/23/2021   Thought to be due to uncontrolled HTN // Improved to normal  EF in 12/16: 45-50 EF in 4/18: 60-65   Valvular cardiomyopathy (HCC)     Past Surgical History:  Procedure Laterality Date   TEE WITHOUT CARDIOVERSION N/A 09/14/2022   Procedure: TRANSESOPHAGEAL ECHOCARDIOGRAM;  Surgeon: Thomasene Ripple, DO;  Location: [facility];  Service: Cardiovascular;  Laterality: N/A;   US ECHOCARDIOGRAPHY  08/28/2008   EF  55-60%   US ECHOCARDIOGRAPHY  05/06/2004   EF 55-60%    Current Medications: Current Meds  Medication Sig   amLODipine (NORVASC) 10 MG tablet Take 1 tablet (10 mg total) by mouth daily.   aspirin EC 81 MG tablet Take 81 mg by mouth daily.     B Complex-C (B-COMPLEX WITH VITAMIN C) tablet Take 1 tablet by mouth daily.   carvedilol (COREG) 12.5 MG tablet Take 1 tablet (12.5 mg total) by mouth 2 (two) times daily.   furosemide (LASIX) 40 MG tablet TAKE 1 TABLET BY MOUTH EVERY DAY AS NEEDED FOR FLUID RETENTION   Multiple Vitamin (MULTIVITAMIN) tablet Take 1 tablet by mouth every other day.    rosuvastatin (CRESTOR) 10 MG tablet TAKE 1 TABLET(10 MG) BY MOUTH DAILY   sacubitril-valsartan (ENTRESTO) 49-51 MG Take 1 tablet by mouth 2 (two) times daily.   VITAMIN D, CHOLECALCIFEROL, PO Take 1 capsule by mouth daily.     Allergies:   Patient has no known allergies.   Social History   Socioeconomic History   Marital status: Single    Spouse name: Not on file   Number of children: Not on file   Years of education: Not on file   Highest education level: Not on file  Occupational History   Not on file  Tobacco Use   Smoking status: Never   Smokeless tobacco: Never  Substance and Sexual Activity   Alcohol use: No   Drug  use: No   Sexual activity: Not on file  Other Topics Concern   Not on file  Social History Narrative   Not on file   Social Drivers of Health   Financial Resource Strain: Not on file  Food Insecurity: Not on file  Transportation Needs: Not on file  Physical Activity: Not on file  Stress: Not on file  Social Connections: Not on file     Family History: The patient's family history includes Hypertension in her father; Kidney failure in her father; Mitral valve prolapse in her sister and sister.  ROS:   Review of Systems  Constitution: Negative for decreased appetite, fever and weight gain.  HENT: Negative for congestion, ear discharge, hoarse voice and sore  throat.   Eyes: Negative for discharge, redness, vision loss in right eye and visual halos.  Cardiovascular: Negative for chest pain, dyspnea on exertion, leg swelling, orthopnea and palpitations.  Respiratory: Negative for cough, hemoptysis, shortness of breath and snoring.   Endocrine: Negative for heat intolerance and polyphagia.  Hematologic/Lymphatic: Negative for bleeding problem. Does not bruise/bleed easily.  Skin: Negative for flushing, nail changes, rash and suspicious lesions.  Musculoskeletal: Negative for arthritis, joint pain, muscle cramps, myalgias, neck pain and stiffness.  Gastrointestinal: Negative for abdominal pain, bowel incontinence, diarrhea and excessive appetite.  Genitourinary: Negative for decreased libido, genital sores and incomplete emptying.  Neurological: Negative for brief paralysis, focal weakness, headaches and loss of balance.  Psychiatric/Behavioral: Negative for altered mental status, depression and suicidal ideas.  Allergic/Immunologic: Negative for HIV exposure and persistent infections.    EKGs/Labs/Other Studies Reviewed:    The following studies were reviewed today:   EKG:  The ekg ordered today demonstrates sinus rhythm, heart rate 76 bpm with left ventricular hypertrophy.  Recent Labs: 05/20/2022: ALT 13; BUN 22; Creatinine, Ser 1.00; Potassium 4.9; Sodium 145  Recent Lipid Panel    Component Value Date/Time   CHOL 182 05/20/2022 0852   TRIG 55 05/20/2022 0852   HDL 66 05/20/2022 0852   CHOLHDL 2.8 05/20/2022 0852   CHOLHDL 3 03/06/2014 0852   VLDL 18.6 03/06/2014 0852   LDLCALC 105 (H) 05/20/2022 0852   LDLDIRECT 128.9 03/05/2013 0932    Physical Exam:    VS:  BP (!) 178/80 (BP Location: Right Arm, Patient Position: Sitting, Cuff Size: Normal)   Pulse 77   Ht 5\' 7"  (1.702 m)   Wt 162 lb (73.5 kg)   LMP 02/04/2012 Comment: pre menopausal  SpO2 93%   BMI 25.37 kg/m     Wt Readings from Last 3 Encounters:  04/04/23 162 lb  (73.5 kg)  09/14/22 148 lb (67.1 kg)  09/06/22 150 lb 12.8 oz (68.4 kg)     GEN: Well nourished, well developed in no acute distress HEENT: Normal NECK: No JVD; No carotid bruits LYMPHATICS: No lymphadenopathy CARDIAC: S1S2 noted,RRR, no murmurs, rubs, gallops RESPIRATORY:  Clear to auscultation without rales, wheezing or rhonchi  ABDOMEN: Soft, non-tender, non-distended, +bowel sounds, no guarding. EXTREMITIES: No edema, No cyanosis, no clubbing MUSCULOSKELETAL:  No deformity  SKIN: Warm and dry NEUROLOGIC:  Alert and oriented x 3, non-focal PSYCHIATRIC:  Normal affect, good insight  ASSESSMENT:    1. Essential hypertension   2. Aortic valve disease   3. Nonischemic cardiomyopathy (HCC)    PLAN:     Aortic regurgitation Moderate to severe aortic regurgitation confirmed by echocardiogram. Discussed surgical options, emphasizing timely intervention to prevent complications. - Order repeat echocardiogram to reassess her aortic stenosis. -  Refer to cardiac surgeon for evaluation post-echocardiogram is severe aortic regurgitation. - Monitor for symptoms such as dyspnea, edema, or increased fatigue.  Hypertension Blood pressure elevated at 172 mmHg. Missed medication today. Plan for close follow-up to manage hypertension. - Continue current antihypertensive medications. - Schedule follow-up with pharmacist or APP in 4 to 6 weeks.  I have asked the patient take her blood pressure daily as well. - Monitor blood pressure closely.  Nonischemic cardiomyopathy-continue current medication dosing.  The patient is in agreement with the above plan. The patient left the office in stable condition.  The patient will follow up in   Medication Adjustments/Labs and Tests Ordered: Current medicines are reviewed at length with the patient today.  Concerns regarding medicines are outlined above.  Orders Placed This Encounter  Procedures   Comprehensive Metabolic Panel (CMET)   Magnesium    EKG 12-Lead   ECHOCARDIOGRAM COMPLETE   No orders of the defined types were placed in this encounter.   Patient Instructions  Medication Instructions:  No Changes *If you need a refill on your cardiac medications before your next appointment, please call your pharmacy*   Lab Work: CMET and Magnesium Today If you have labs (blood work) drawn today and your tests are completely normal, you will receive your results only by: MyChart Message (if you have MyChart) OR A paper copy in the mail If you have any lab test that is abnormal or we need to change your treatment, we will call you to review the results.   Testing/Procedures: Your physician has requested that you have an echocardiogram. Echocardiography is a painless test that uses sound waves to create images of your heart. It provides your doctor with information about the size and shape of your heart and how well your heart's chambers and valves are working. This procedure takes approximately one hour. There are no restrictions for this procedure. Please do NOT wear cologne, perfume, aftershave, or lotions (deodorant is allowed). Please arrive 15 minutes prior to your appointment time. This will take place at [address] [place]    Follow-Up: At [facility], you and your health needs are our priority.  As part of our continuing mission to provide you with exceptional heart care, we have created designated Provider Care Teams.  These Care Teams include your primary Cardiologist (physician) and Advanced Practice Providers (APPs -  Physician Assistants and Nurse Practitioners) who all work together to provide you with the care you need, when you need it.     Your next appointment:   4-6 week(s)  Provider:   Tereso Newcomer, PA or PharmD  Then in 6 months with: Thomasene Ripple, DO             Adopting a Healthy Lifestyle.  Know what a healthy weight is for you (roughly BMI <25) and aim to maintain this   Aim  for 7+ servings of fruits and vegetables daily   65-80+ fluid ounces of water or unsweet tea for healthy kidneys   Limit to max 1 drink of alcohol per day; avoid smoking/tobacco   Limit animal fats in diet for cholesterol and heart health - choose grass fed whenever available   Avoid highly processed foods, and foods high in saturated/trans fats   Aim for low stress - take time to unwind and care for your mental health   Aim for 150 min of moderate intensity exercise weekly for heart health, and weights twice weekly for bone health  Aim for 7-9 hours of sleep daily   When it comes to diets, agreement about the perfect plan isnt easy to find, even among the experts. Experts at the [facility] [employer] developed an idea known as the Healthy Eating Plate. Just imagine a plate divided into logical, healthy portions.   The emphasis is on diet quality:   Load up on vegetables and fruits - one-half of your plate: Aim for color and variety, and remember that potatoes dont count.   Go for whole grains - one-quarter of your plate: Whole wheat, barley, wheat berries, quinoa, oats, brown rice, and foods made with them. If you want pasta, go with whole wheat pasta.   Protein power - one-quarter of your plate: Fish, chicken, beans, and nuts are all healthy, versatile protein sources. Limit red meat.   The diet, however, does go beyond the plate, offering a few other suggestions.   Use healthy plant oils, such as olive, canola, soy, corn, sunflower and peanut. Check the labels, and avoid partially hydrogenated oil, which have unhealthy trans fats.   If youre thirsty, drink water. Coffee and tea are good in moderation, but skip sugary drinks and limit milk and dairy products to one or two daily servings.   The type of carbohydrate in the diet is more important than the amount. Some sources of carbohydrates, such as vegetables, fruits, whole grains, and beans-are healthier than  others.   Finally, stay active  Signed, Thomasene Ripple, DO  04/04/2023 8:59 AM    [facility]

## 2023-04-04 NOTE — Patient Instructions (Addendum)
 Medication Instructions:  No Changes *If you need a refill on your cardiac medications before your next appointment, please call your pharmacy*   Lab Work: CMET and Magnesium Today If you have labs (blood work) drawn today and your tests are completely normal, you will receive your results only by: MyChart Message (if you have MyChart) OR A paper copy in the mail If you have any lab test that is abnormal or we need to change your treatment, we will call you to review the results.   Testing/Procedures: Your physician has requested that you have an echocardiogram. Echocardiography is a painless test that uses sound waves to create images of your heart. It provides your doctor with information about the size and shape of your heart and how well your heart's chambers and valves are working. This procedure takes approximately one hour. There are no restrictions for this procedure. Please do NOT wear cologne, perfume, aftershave, or lotions (deodorant is allowed). Please arrive 15 minutes prior to your appointment time. This will take place at [address] [place]    Follow-Up: At [facility], you and your health needs are our priority.  As part of our continuing mission to provide you with exceptional heart care, we have created designated Provider Care Teams.  These Care Teams include your primary Cardiologist (physician) and Advanced Practice Providers (APPs -  Physician Assistants and Nurse Practitioners) who all work together to provide you with the care you need, when you need it.     Your next appointment:   4-6 week(s)  Provider:   Tereso Newcomer, PA or PharmD  Then in 6 months with: Thomasene Ripple, DO

## 2023-04-05 ENCOUNTER — Ambulatory Visit (HOSPITAL_COMMUNITY): Attending: Cardiology

## 2023-04-05 DIAGNOSIS — I359 Nonrheumatic aortic valve disorder, unspecified: Secondary | ICD-10-CM | POA: Diagnosis not present

## 2023-04-05 DIAGNOSIS — I10 Essential (primary) hypertension: Secondary | ICD-10-CM | POA: Diagnosis present

## 2023-04-05 LAB — ECHOCARDIOGRAM COMPLETE
Area-P 1/2: 4.77 cm2
Est EF: 55
P 1/2 time: 434 ms
S' Lateral: 3 cm

## 2023-04-10 ENCOUNTER — Encounter: Payer: Self-pay | Admitting: Cardiology

## 2023-04-12 ENCOUNTER — Other Ambulatory Visit: Payer: Self-pay

## 2023-04-12 DIAGNOSIS — Z79899 Other long term (current) drug therapy: Secondary | ICD-10-CM

## 2023-04-12 DIAGNOSIS — R899 Unspecified abnormal finding in specimens from other organs, systems and tissues: Secondary | ICD-10-CM

## 2023-04-19 ENCOUNTER — Encounter: Payer: Self-pay | Admitting: Cardiology

## 2023-05-18 ENCOUNTER — Ambulatory Visit: Payer: Self-pay | Admitting: Pharmacist

## 2023-06-07 ENCOUNTER — Other Ambulatory Visit: Payer: Self-pay | Admitting: Physician Assistant

## 2023-06-07 DIAGNOSIS — I351 Nonrheumatic aortic (valve) insufficiency: Secondary | ICD-10-CM

## 2023-06-07 DIAGNOSIS — E7800 Pure hypercholesterolemia, unspecified: Secondary | ICD-10-CM

## 2023-06-07 DIAGNOSIS — I350 Nonrheumatic aortic (valve) stenosis: Secondary | ICD-10-CM

## 2023-06-27 IMAGING — CT CT HEART MORP W/ CTA COR W/ SCORE W/ CA W/CM &/OR W/O CM
1 series · 8 of 10 positions shown, 10 images · non-contrast
Comparison: No recent relevant priors available at time dictation.
COMPARISON: No recent relevant priors available at time dictation.

Addendum:
EXAM:
OVER-READ INTERPRETATION  CT CHEST

The following report is an over-read performed by radiologist Dr.
Rhode Lepe [REDACTED] on 04/07/2021. This
over-read does not include interpretation of cardiac or coronary
anatomy or pathology. The coronary calcium score/coronary CTA
interpretation by the cardiologist is attached.
CLINICAL DATA: 56F with HFrEF (EF 40-45% on echo)
Cardiac/Coronary CTA
TECHNIQUE: The patient was scanned on a Phillips Force scanner.

[Series 2587: coronaries · 8 of 10 slices shown, 10 images]
[im 2/10  vessel]
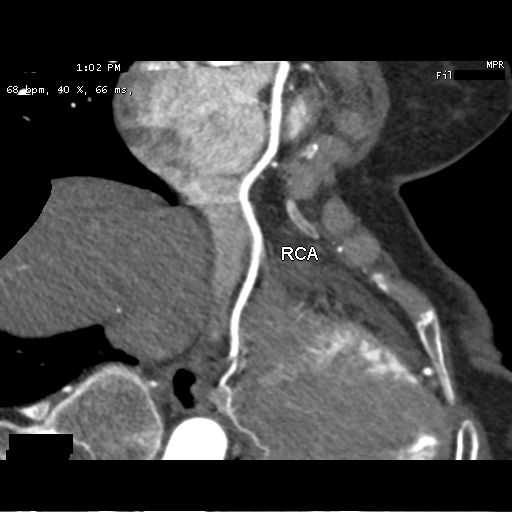
[im 2/10  lung]
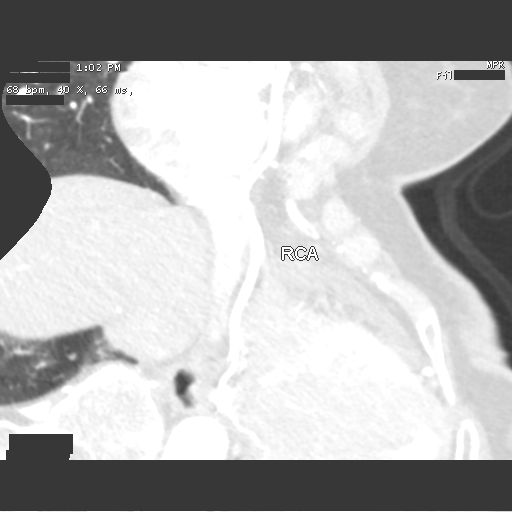
[im 3/10  vessel]
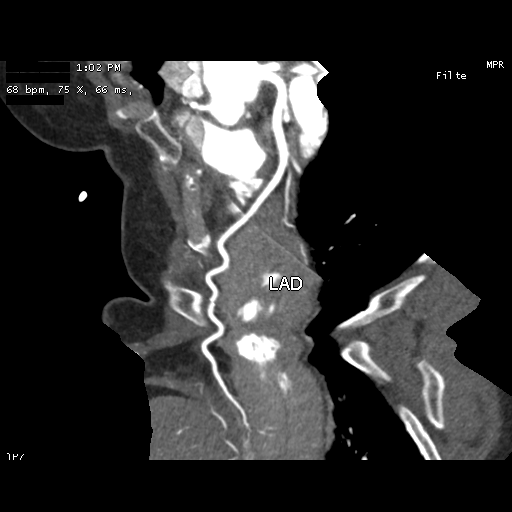
[im 4/10  vessel]
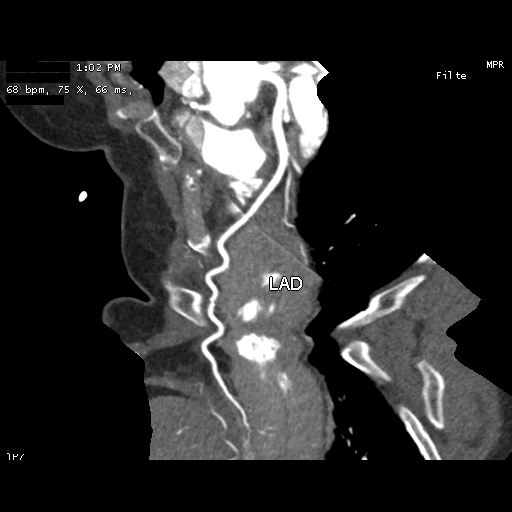
[im 5/10  vessel]
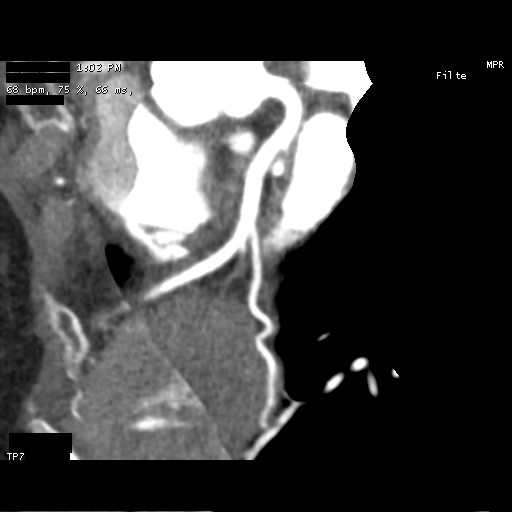
[im 6/10  vessel]
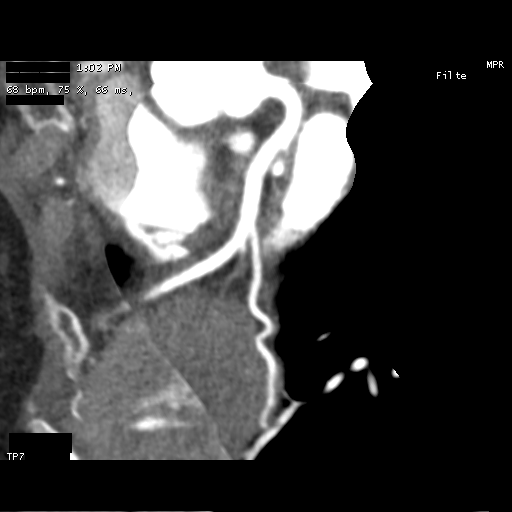
[im 6/10  lung]
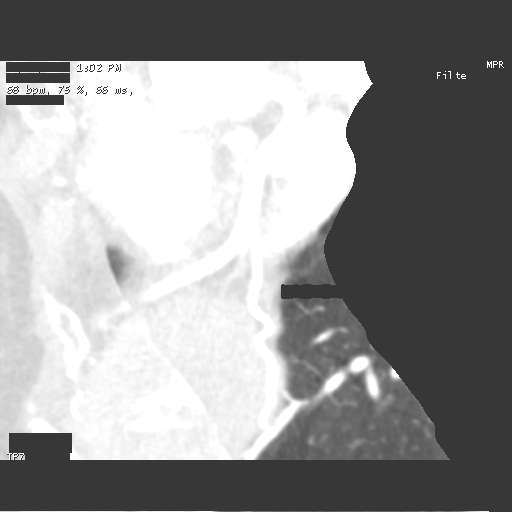
[im 7/10  vessel]
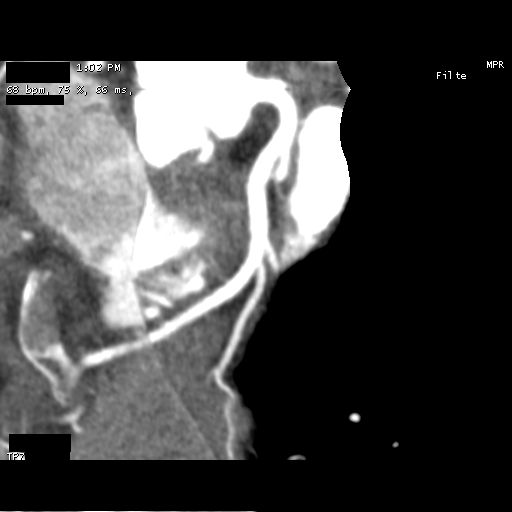
[im 8/10  vessel]
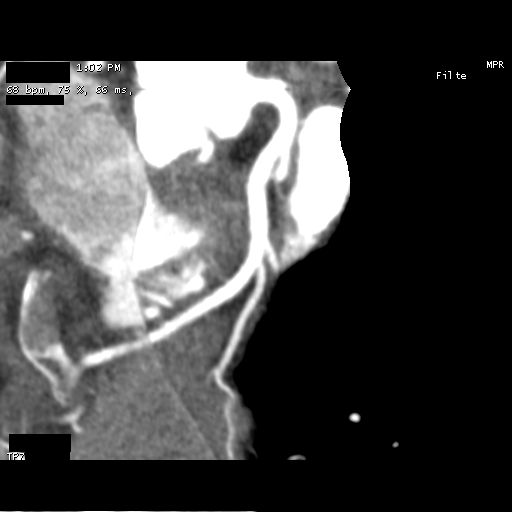
[im 9/10  vessel]
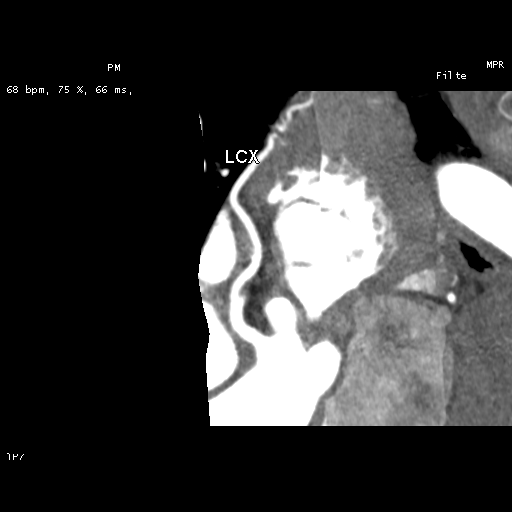

[8 of 10 positions shown; findings below may reference images not displayed]

FINDINGS: Vascular: No significant vascular findings.

Mediastinum/Nodes: No mediastinal or hilar adenopathy identified.
Visualized portions of the esophagus are within normal limits.

Lungs/Pleura: No suspicious pulmonary nodules or masses.
Hypoventilatory change in the dependent lungs. No pleural effusion.
No pneumothorax.

Upper Abdomen: No acute abnormality.

Musculoskeletal: No acute osseous abnormality.
IMPRESSION: No acute or significant extracardiac findings.
FINDINGS: A 100 kV prospective scan was triggered in the descending thoracic
aorta at 111 HU's. Axial non-contrast 3 mm slices were carried out
through the heart. The data set was analyzed on a dedicated work
station and scored using the Agatson method. Gantry rotation speed
was 250 msecs and collimation was .6 mm. 0.8 mg of sl NTG was given.
The 3D data set was reconstructed in 5% intervals of the 67-82 % of
the R-R cycle. Phases were analyzed on a dedicated work station
using MPR, MIP and VRT modes. The patient received 80 cc of
contrast.

Coronary Arteries:  Normal coronary origin.  Right dominance.

RCA is a large dominant artery that gives rise to PDA and PLA. Step
artifact prevents interpretation of ostial RCA. There is no plaque.

Left main is a large artery that gives rise to LAD and LCX arteries.

LAD is a large vessel that has no plaque.

LCX is a non-dominant artery that gives rise to one large OM1
branch. There is no plaque.

Other findings:

Left Ventricle: Normal size

Left Atrium: Normal size

Pulmonary Veins: Normal configuration

Right Ventricle: Normal size

Right Atrium: Normal size

Cardiac valves: Mild AV calcifications

Thoracic aorta: Normal size

Pulmonary Arteries: Normal size

Systemic Veins: Normal drainage

Pericardium: Normal thickness
IMPRESSION: 1. Coronary calcium score of 0.

2. Normal coronary origin with right dominance.

3. No evidence of CAD, though step artifact prevents interpretation
of ostial RCA.

5. Mild aortic valve calcifications

CAD-RADS 0. No evidence of CAD (0%). Consider non-atherosclerotic
causes of chest pain.

*** End of Addendum ***
EXAM:
OVER-READ INTERPRETATION  CT CHEST

The following report is an over-read performed by radiologist Dr.
Rhode Lepe [REDACTED] on 04/07/2021. This
over-read does not include interpretation of cardiac or coronary
anatomy or pathology. The coronary calcium score/coronary CTA
interpretation by the cardiologist is attached.
FINDINGS: Vascular: No significant vascular findings.

Mediastinum/Nodes: No mediastinal or hilar adenopathy identified.
Visualized portions of the esophagus are within normal limits.

Lungs/Pleura: No suspicious pulmonary nodules or masses.
Hypoventilatory change in the dependent lungs. No pleural effusion.
No pneumothorax.

Upper Abdomen: No acute abnormality.

Musculoskeletal: No acute osseous abnormality.
IMPRESSION: No acute or significant extracardiac findings.

## 2023-11-06 ENCOUNTER — Other Ambulatory Visit: Payer: Self-pay | Admitting: Physician Assistant

## 2024-01-05 ENCOUNTER — Other Ambulatory Visit: Payer: Self-pay | Admitting: Cardiology

## 2024-05-10 ENCOUNTER — Ambulatory Visit: Payer: Self-pay | Admitting: Physician Assistant
